# Patient Record
Sex: Male | Born: 1940
Health system: Southern US, Community
[De-identification: ages and names within clinical notes are randomized; demographics above are authoritative.]

## PROBLEM LIST (undated history)

## (undated) DIAGNOSIS — C649 Malignant neoplasm of unspecified kidney, except renal pelvis: Secondary | ICD-10-CM

## (undated) DIAGNOSIS — R7303 Prediabetes: Secondary | ICD-10-CM

## (undated) DIAGNOSIS — I10 Essential (primary) hypertension: Secondary | ICD-10-CM

## (undated) DIAGNOSIS — N400 Enlarged prostate without lower urinary tract symptoms: Secondary | ICD-10-CM

## (undated) DIAGNOSIS — E785 Hyperlipidemia, unspecified: Secondary | ICD-10-CM

## (undated) DIAGNOSIS — N2889 Other specified disorders of kidney and ureter: Secondary | ICD-10-CM

## (undated) DIAGNOSIS — E119 Type 2 diabetes mellitus without complications: Secondary | ICD-10-CM

## (undated) HISTORY — PX: APPENDECTOMY: SHX54

## (undated) HISTORY — DX: Other specified disorders of kidney and ureter: N28.89

## (undated) HISTORY — PX: TRANSURETHRAL RESECTION OF PROSTATE: SHX73

## (undated) HISTORY — PX: TONSILLECTOMY: SUR1361

## (undated) HISTORY — DX: Prediabetes: R73.03

## (undated) HISTORY — DX: Malignant neoplasm of unspecified kidney, except renal pelvis: C64.9

## (undated) HISTORY — DX: Hyperlipidemia, unspecified: E78.5

## (undated) HISTORY — DX: Benign prostatic hyperplasia without lower urinary tract symptoms: N40.0

## (undated) HISTORY — DX: Essential (primary) hypertension: I10

---

## 2004-04-20 ENCOUNTER — Ambulatory Visit: Payer: Self-pay | Admitting: General Surgery

## 2004-12-15 ENCOUNTER — Ambulatory Visit: Payer: Self-pay | Admitting: Internal Medicine

## 2004-12-21 ENCOUNTER — Ambulatory Visit: Payer: Self-pay | Admitting: Internal Medicine

## 2004-12-28 ENCOUNTER — Other Ambulatory Visit: Payer: Self-pay

## 2005-01-02 ENCOUNTER — Ambulatory Visit: Payer: Self-pay | Admitting: Urology

## 2005-01-08 DIAGNOSIS — C649 Malignant neoplasm of unspecified kidney, except renal pelvis: Secondary | ICD-10-CM

## 2005-01-08 HISTORY — DX: Malignant neoplasm of unspecified kidney, except renal pelvis: C64.9

## 2005-01-09 ENCOUNTER — Inpatient Hospital Stay: Payer: Self-pay | Admitting: Urology

## 2005-04-24 ENCOUNTER — Ambulatory Visit: Payer: Self-pay | Admitting: Urology

## 2005-07-02 ENCOUNTER — Emergency Department: Payer: Self-pay | Admitting: Emergency Medicine

## 2005-07-02 ENCOUNTER — Other Ambulatory Visit: Payer: Self-pay

## 2005-07-09 ENCOUNTER — Ambulatory Visit: Payer: Self-pay | Admitting: Internal Medicine

## 2005-07-10 ENCOUNTER — Ambulatory Visit: Payer: Self-pay | Admitting: Internal Medicine

## 2006-01-08 HISTORY — PX: RENAL MASS EXCISION: SHX2324

## 2006-01-18 ENCOUNTER — Ambulatory Visit: Payer: Self-pay | Admitting: Urology

## 2006-08-19 ENCOUNTER — Ambulatory Visit: Payer: Self-pay | Admitting: Urology

## 2007-01-23 ENCOUNTER — Ambulatory Visit: Payer: Self-pay | Admitting: Urology

## 2007-02-06 ENCOUNTER — Ambulatory Visit: Payer: Self-pay | Admitting: Urology

## 2007-08-13 ENCOUNTER — Ambulatory Visit: Payer: Self-pay | Admitting: Urology

## 2008-02-18 ENCOUNTER — Ambulatory Visit: Payer: Self-pay | Admitting: Urology

## 2008-04-22 ENCOUNTER — Inpatient Hospital Stay: Payer: Self-pay | Admitting: Internal Medicine

## 2008-05-21 ENCOUNTER — Ambulatory Visit: Payer: Self-pay | Admitting: Internal Medicine

## 2008-06-08 ENCOUNTER — Ambulatory Visit: Payer: Self-pay | Admitting: Internal Medicine

## 2008-07-08 ENCOUNTER — Ambulatory Visit: Payer: Self-pay | Admitting: Internal Medicine

## 2008-08-08 ENCOUNTER — Ambulatory Visit: Payer: Self-pay | Admitting: Internal Medicine

## 2008-08-19 ENCOUNTER — Ambulatory Visit: Payer: Self-pay | Admitting: Urology

## 2009-02-17 ENCOUNTER — Ambulatory Visit: Payer: Self-pay | Admitting: Urology

## 2009-08-23 ENCOUNTER — Ambulatory Visit: Payer: Self-pay | Admitting: Urology

## 2010-02-13 ENCOUNTER — Ambulatory Visit: Payer: Self-pay | Admitting: Urology

## 2011-03-13 ENCOUNTER — Ambulatory Visit: Payer: Self-pay | Admitting: Urology

## 2012-03-18 ENCOUNTER — Ambulatory Visit: Payer: Self-pay | Admitting: Urology

## 2012-04-20 ENCOUNTER — Ambulatory Visit: Payer: Self-pay

## 2013-02-18 ENCOUNTER — Ambulatory Visit: Payer: Self-pay | Admitting: Urology

## 2014-02-12 ENCOUNTER — Ambulatory Visit: Payer: Self-pay | Admitting: Urology

## 2014-08-25 ENCOUNTER — Other Ambulatory Visit: Payer: Self-pay

## 2014-08-25 DIAGNOSIS — N281 Cyst of kidney, acquired: Secondary | ICD-10-CM

## 2014-08-27 ENCOUNTER — Other Ambulatory Visit: Payer: Self-pay

## 2014-08-27 DIAGNOSIS — N281 Cyst of kidney, acquired: Secondary | ICD-10-CM

## 2015-02-04 DIAGNOSIS — E119 Type 2 diabetes mellitus without complications: Secondary | ICD-10-CM | POA: Diagnosis not present

## 2015-02-07 DIAGNOSIS — I10 Essential (primary) hypertension: Secondary | ICD-10-CM | POA: Diagnosis not present

## 2015-02-07 DIAGNOSIS — E119 Type 2 diabetes mellitus without complications: Secondary | ICD-10-CM | POA: Diagnosis not present

## 2015-02-14 ENCOUNTER — Ambulatory Visit
Admission: RE | Admit: 2015-02-14 | Discharge: 2015-02-14 | Disposition: A | Discharge status: Home / Self Care | Payer: PPO | Source: Ambulatory Visit | Attending: Urology | Admitting: Urology

## 2015-02-14 DIAGNOSIS — Q6100 Congenital renal cyst, unspecified: Secondary | ICD-10-CM | POA: Insufficient documentation

## 2015-02-14 DIAGNOSIS — N281 Cyst of kidney, acquired: Secondary | ICD-10-CM

## 2015-02-25 ENCOUNTER — Encounter: Payer: Self-pay | Admitting: Urology

## 2015-02-25 ENCOUNTER — Telehealth: Payer: Self-pay | Admitting: Urology

## 2015-02-25 ENCOUNTER — Ambulatory Visit (INDEPENDENT_AMBULATORY_CARE_PROVIDER_SITE_OTHER): Payer: PPO | Admitting: Urology

## 2015-02-25 VITALS — BP 134/77 | HR 60 | Ht 68.0 in | Wt 203.3 lb

## 2015-02-25 DIAGNOSIS — Z85528 Personal history of other malignant neoplasm of kidney: Secondary | ICD-10-CM | POA: Diagnosis not present

## 2015-02-25 DIAGNOSIS — Q6100 Congenital renal cyst, unspecified: Secondary | ICD-10-CM

## 2015-02-25 DIAGNOSIS — N281 Cyst of kidney, acquired: Secondary | ICD-10-CM

## 2015-02-25 NOTE — Progress Notes (Signed)
02/25/2015 5:18 PM   Mark Riggs [birth year] [id number]  Referring provider: No referring provider defined for this encounter.  Chief Complaint  Patient presents with  . Renal Mass    1year    HPI: 75 yo s/p open left partial nephrectomy by Dr. Ernst Spell in 2007 for Mark Riggs.  He returns for annual surviellence.    He denies any flank pain or gross hematuria.  Denies any voiding issues. No weight loss.  Follow-up renal ultrasound shows no evidence of recurrence of the mass. Previous CT scans reviewed before and after partial nephrectomy indicate that this was pT1a mass based on size (4 cm on previous imaging), pathology unknown. Records have been requested.   PMH: Past Medical History  Diagnosis Date  . Pre-diabetes   . Hypertension   . Hyperlipemia   . Renal mass   . Renal cancer (Mark Riggs) 2007    left  . BPH (benign prostatic hyperplasia)     Surgical History: Past Surgical History  Procedure Laterality Date  . Appendectomy    . Renal mass excision  2008  . Transurethral resection of prostate      patient unsure about this surgery long time ago by Dr. Ernst Spell    Home Medications:    Medication List       This list is accurate as of: 02/25/15 11:59 PM.  Always use your most recent med list.               ALPRAZolam 1 MG tablet  Commonly known as:  XANAX  Take 1 mg by mouth 3 (three) times daily as needed for anxiety.     aspirin 81 MG tablet  Take 81 mg by mouth daily.     glipiZIDE 5 MG tablet  Commonly known as:  GLUCOTROL  Take by mouth daily before breakfast.     simvastatin 10 MG tablet  Commonly known as:  ZOCOR  Take 10 mg by mouth daily.        Allergies:  Allergies  Allergen Reactions  . Contrast Media [Iodinated Diagnostic Agents] Swelling  . Sulfa Antibiotics Swelling    Family History: History reviewed. No pertinent family history.  Social History:  reports that he has quit smoking. He does not have any smokeless tobacco history  on file. He reports that he does not drink alcohol or use illicit drugs.  ROS: 12 point review of systems performed, and negative other than as per history of present illness.  Physical Exam: BP 134/77 mmHg  Pulse 60  Ht 5\' 8"  (1.727 m)  Wt 203 lb 4.8 oz (92.216 kg)  BMI 30.92 kg/m2  Constitutional:  Alert and oriented, No acute distress. HEENT: [place] AT, moist mucus membranes.  Trachea midline, no masses. Cardiovascular: No clubbing, cyanosis, or edema. Respiratory: Normal respiratory effort, no increased work of breathing. GI: Abdomen is soft, nontender, nondistended, no abdominal masses.  Left vertical flank incision identified, well-healed. No hernias. GU: No CVA tenderness.  Skin: No rashes, bruises or suspicious lesions. Neurologic: Grossly intact, no focal deficits, moving all 4 extremities. Psychiatric: Normal mood and affect.  Laboratory Data: N/a  Pertinent Imaging: CLINICAL DATA: Renal cyst, followup, history LEFT renal cancer 10 years ago  EXAM: RENAL / URINARY TRACT ULTRASOUND COMPLETE  COMPARISON: 02/12/2014  FINDINGS: Right Kidney:  Length: 12.0 cm. Normal cortical thickness. Borderline increased cortical echogenicity. No hydronephrosis or shadowing calcification. Tiny exophytic cyst at lower pole 12 x 11 x 12 mm. Larger cysts mid pole 4.2 x  4.2 x 3.7 cm, simple features, slightly larger than the 3.8 x 3.4 x 3.6 cm on prior exam. No solid mass.  Left Kidney:  Length: 10.9 cm. Multiple small cysts with largest at upper pole demonstrating simple features and measuring 3.1 x 1.9 x 3.4 cm, previously 2.3 x 2.3 x 1.8 cm. No solid mass, hydronephrosis or shadowing calcification.  Bladder:  Normal appearance. LEFT ureteral jet was visualized but with RIGHT ureteral jet was not seen during the period of imaging.  IMPRESSION: BILATERAL renal cysts, largest of which are slightly larger than on the previous exam but retaining simple features.  No  new abnormalities.   Electronically Signed  By: Lavonia Dana M.D.  On: 02/14/2015 14:50         Previous CT imaging prior to and following partial nephrectomy were reviewed today along with the above renal ultrasound.  Assessment & Plan:    1. History of kidney cancer 10 years status post left open partial nephrectomy. Follow-up renal ultrasound shows no evidence of recurrence. Given the relatively small size of the tumor without evidence of recurrence now 10 years later for a  pT1a tumor, no further surveillance is indicated.  Patient was advised to return if he develops flank pain, hematuria, or any other worrisome symptoms.  2. Renal cyst Benign bilateral simple cysts identified, asymptomatic.  Hollice Espy, MD  [facility] [address], [place], [place] [address] [phone]

## 2015-02-25 NOTE — Telephone Encounter (Signed)
Just F.Y.I. From pt Dr Erlene Quan saw this morning.  Alprazolam 1 mg 3 x per day Lisinopril 2.5 mg 1 x day Simvastatin 40 mg 1 x day Glimepiride 1 mg 1/2 x day, 1 whole next day, 1/2 next day, 1 whole next day

## 2015-03-02 ENCOUNTER — Encounter: Payer: Self-pay | Admitting: Urology

## 2015-03-16 ENCOUNTER — Other Ambulatory Visit: Payer: Self-pay | Admitting: Internal Medicine

## 2015-08-10 DIAGNOSIS — E119 Type 2 diabetes mellitus without complications: Secondary | ICD-10-CM | POA: Diagnosis not present

## 2015-08-15 DIAGNOSIS — K5730 Diverticulosis of large intestine without perforation or abscess without bleeding: Secondary | ICD-10-CM | POA: Diagnosis not present

## 2015-08-15 DIAGNOSIS — E119 Type 2 diabetes mellitus without complications: Secondary | ICD-10-CM | POA: Diagnosis not present

## 2015-08-15 DIAGNOSIS — E785 Hyperlipidemia, unspecified: Secondary | ICD-10-CM | POA: Diagnosis not present

## 2015-08-15 DIAGNOSIS — K210 Gastro-esophageal reflux disease with esophagitis: Secondary | ICD-10-CM | POA: Diagnosis not present

## 2015-09-22 DIAGNOSIS — Z23 Encounter for immunization: Secondary | ICD-10-CM | POA: Diagnosis not present

## 2016-02-15 DIAGNOSIS — E119 Type 2 diabetes mellitus without complications: Secondary | ICD-10-CM | POA: Diagnosis not present

## 2016-02-15 DIAGNOSIS — E785 Hyperlipidemia, unspecified: Secondary | ICD-10-CM | POA: Diagnosis not present

## 2016-02-22 DIAGNOSIS — E119 Type 2 diabetes mellitus without complications: Secondary | ICD-10-CM | POA: Diagnosis not present

## 2016-02-22 DIAGNOSIS — E785 Hyperlipidemia, unspecified: Secondary | ICD-10-CM | POA: Diagnosis not present

## 2016-03-06 DIAGNOSIS — L570 Actinic keratosis: Secondary | ICD-10-CM | POA: Diagnosis not present

## 2016-03-06 DIAGNOSIS — Z872 Personal history of diseases of the skin and subcutaneous tissue: Secondary | ICD-10-CM | POA: Diagnosis not present

## 2016-03-06 DIAGNOSIS — L821 Other seborrheic keratosis: Secondary | ICD-10-CM | POA: Diagnosis not present

## 2016-05-07 ENCOUNTER — Encounter: Payer: Self-pay | Admitting: Emergency Medicine

## 2016-05-07 ENCOUNTER — Ambulatory Visit
Admission: EM | Admit: 2016-05-07 | Discharge: 2016-05-07 | Disposition: A | Discharge status: Home / Self Care | Payer: PPO | Attending: Family Medicine | Admitting: Family Medicine

## 2016-05-07 DIAGNOSIS — S39012A Strain of muscle, fascia and tendon of lower back, initial encounter: Secondary | ICD-10-CM

## 2016-05-07 DIAGNOSIS — M545 Low back pain: Secondary | ICD-10-CM

## 2016-05-07 LAB — URINALYSIS, COMPLETE (UACMP) WITH MICROSCOPIC
Bacteria, UA: NONE SEEN
Bilirubin Urine: NEGATIVE
GLUCOSE, UA: 250 mg/dL — AB
HGB URINE DIPSTICK: NEGATIVE
Ketones, ur: NEGATIVE mg/dL
LEUKOCYTES UA: NEGATIVE
NITRITE: NEGATIVE
Protein, ur: NEGATIVE mg/dL
RBC / HPF: NONE SEEN RBC/hpf (ref 0–5)
SPECIFIC GRAVITY, URINE: 1.015 (ref 1.005–1.030)
Squamous Epithelial / LPF: NONE SEEN
pH: 5 (ref 5.0–8.0)

## 2016-05-07 MED ORDER — CYCLOBENZAPRINE HCL 10 MG PO TABS: 10.0000 mg | ORAL_TABLET | Freq: Every day | ORAL | 0 refills | Status: DC

## 2016-05-07 NOTE — ED Provider Notes (Signed)
[facility]    CSN: [id number] Arrival date & time: 05/07/16  0903     History   Chief Complaint Chief Complaint  Patient presents with  . Back Pain    left side    HPI Mark Riggs is a 76 y.o. male.    Back Pain  Location:  Lumbar spine Quality:  Aching Radiates to:  Does not radiate Pain severity:  Moderate Pain is:  Same all the time Onset quality:  Sudden Duration:  7 days Timing:  Constant Progression:  Unchanged Chronicity:  New Context: lifting heavy objects and twisting   Context: not emotional stress, not falling, not jumping from heights, not MCA, not MVA, not occupational injury, not pedestrian accident, not physical stress, not recent illness and not recent injury   Context comment:  States symptoms began after doing some lifting and twisting Relieved by:  Nothing Ineffective treatments:  OTC medications Associated symptoms: no abdominal pain, no abdominal swelling, no bladder incontinence, no bowel incontinence, no chest pain, no dysuria, no fever, no headaches, no leg pain, no numbness, no paresthesias, no pelvic pain, no perianal numbness, no tingling, no weakness and no weight loss   Risk factors: no recent surgery     Past Medical History:  Diagnosis Date  . BPH (benign prostatic hyperplasia)   . Hyperlipemia   . Hypertension   . Pre-diabetes   . Renal cancer ([place]) 2007   left  . Renal mass     There are no active problems to display for this patient.   Past Surgical History:  Procedure Laterality Date  . APPENDECTOMY    . RENAL MASS EXCISION  2008  . TRANSURETHRAL RESECTION OF PROSTATE     patient unsure about this surgery long time ago by Dr. Ernst Spell       Home Medications    Prior to Admission medications   Medication Sig Start Date End Date Taking? Authorizing Provider  glimepiride (AMARYL) 2 MG tablet Take 2 mg by mouth daily with breakfast.   Yes Historical Provider, MD  lisinopril (PRINIVIL,ZESTRIL) 5 MG  tablet Take 5 mg by mouth daily.   Yes Historical Provider, MD  simvastatin (ZOCOR) 40 MG tablet Take 40 mg by mouth daily.   Yes Historical Provider, MD  ALPRAZolam Duanne Moron) 1 MG tablet Take 1 mg by mouth 3 (three) times daily as needed for anxiety.    Historical Provider, MD  aspirin 81 MG tablet Take 81 mg by mouth daily.    Historical Provider, MD  cyclobenzaprine (FLEXERIL) 10 MG tablet Take 1 tablet (10 mg total) by mouth at bedtime. 05/07/16   Norval Gable, MD    Family History History reviewed. No pertinent family history.  Social History Social History  Substance Use Topics  . Smoking status: Former Research scientist (life sciences)  . Smokeless tobacco: Never Used  . Alcohol use No     Allergies   Contrast media [iodinated diagnostic agents] and Sulfa antibiotics   Review of Systems Review of Systems  Constitutional: Negative for fever and weight loss.  Cardiovascular: Negative for chest pain.  Gastrointestinal: Negative for abdominal pain and bowel incontinence.  Genitourinary: Negative for bladder incontinence, dysuria and pelvic pain.  Musculoskeletal: Positive for back pain.  Neurological: Negative for tingling, weakness, numbness, headaches and paresthesias.     Physical Exam Triage Vital Signs ED Triage Vitals  Enc Vitals Group     BP 05/07/16 0939 (!) 141/66     Pulse Rate 05/07/16 0939 (!) 57  Resp 05/07/16 0939 16     Temp 05/07/16 0939 97.7 F (36.5 C)     Temp Source 05/07/16 0939 Oral     SpO2 05/07/16 0939 100 %     Weight 05/07/16 0936 200 lb (90.7 kg)     Height 05/07/16 0936 5\' 8"  (1.727 m)     Head Circumference --      Peak Flow --      Pain Score 05/07/16 0936 8     Pain Loc --      Pain Edu? --      Excl. in [place]? --    No data found.   Updated Vital Signs BP (!) 141/66 (BP Location: Right Arm)   Pulse (!) 57   Temp 97.7 F (36.5 C) (Oral)   Resp 16   Ht 5\' 8"  (1.727 m)   Wt 200 lb (90.7 kg)   SpO2 100%   BMI 30.41 kg/m   Visual Acuity Right  Eye Distance:   Left Eye Distance:   Bilateral Distance:    Right Eye Near:   Left Eye Near:    Bilateral Near:     Physical Exam  Constitutional: He appears well-developed and well-nourished. No distress.  Neck: Normal range of motion. Neck supple. No tracheal deviation present.  Pulmonary/Chest: Effort normal. No stridor. No respiratory distress.  Musculoskeletal:       Cervical back: Normal. He exhibits normal range of motion, no tenderness, no bony tenderness, no swelling, no edema, no deformity, no laceration, no pain, no spasm and normal pulse.       Lumbar back: He exhibits tenderness (over the lumbar paraspinous muscles on the left) and spasm. He exhibits normal range of motion, no bony tenderness, no swelling, no edema, no deformity, no laceration, no pain and normal pulse.  Neurological: He is alert. He has normal reflexes. He displays normal reflexes. He exhibits normal muscle tone. Coordination normal.  Skin: No rash noted. He is not diaphoretic.  Nursing note and vitals reviewed.    UC Treatments / Results  Labs (all labs ordered are listed, but only abnormal results are displayed) Labs Reviewed  URINALYSIS, COMPLETE (UACMP) WITH MICROSCOPIC - Abnormal; Notable for the following:       Result Value   Glucose, UA 250 (*)    All other components within normal limits    EKG  EKG Interpretation None       Radiology No results found.  Procedures Procedures (including critical care time)  Medications Ordered in UC Medications - No data to display   Initial Impression / Assessment and Plan / UC Course  I have reviewed the triage vital signs and the nursing notes.  Pertinent labs & imaging results that were available during my care of the patient were reviewed by me and considered in my medical decision making (see chart for details).       Final Clinical Impressions(s) / UC Diagnoses   Final diagnoses:  Strain of lumbar region, initial encounter     New Prescriptions Discharge Medication List as of 05/07/2016 10:11 AM    START taking these medications   Details  cyclobenzaprine (FLEXERIL) 10 MG tablet Take 1 tablet (10 mg total) by mouth at bedtime., Starting Mon 05/07/2016, Normal       1. Lab results and diagnosis reviewed with patient 2. rx as per orders above; reviewed possible side effects, interactions, risks and benefits  3. Recommend supportive treatment with otc analgesics prn, heat/ice, stretching 4. Follow-up  prn if symptoms worsen or don't improve   Norval Gable, MD 05/07/16 1235

## 2016-05-07 NOTE — ED Triage Notes (Signed)
Patient c/o left sided lower back pain that started last Monday.  Patient reports some burning when urinating.

## 2016-08-22 DIAGNOSIS — E785 Hyperlipidemia, unspecified: Secondary | ICD-10-CM | POA: Diagnosis not present

## 2016-08-22 DIAGNOSIS — E119 Type 2 diabetes mellitus without complications: Secondary | ICD-10-CM | POA: Diagnosis not present

## 2016-08-29 DIAGNOSIS — K210 Gastro-esophageal reflux disease with esophagitis: Secondary | ICD-10-CM | POA: Diagnosis not present

## 2016-08-29 DIAGNOSIS — E785 Hyperlipidemia, unspecified: Secondary | ICD-10-CM | POA: Diagnosis not present

## 2016-08-29 DIAGNOSIS — E119 Type 2 diabetes mellitus without complications: Secondary | ICD-10-CM | POA: Diagnosis not present

## 2016-11-03 ENCOUNTER — Ambulatory Visit
Admission: EM | Admit: 2016-11-03 | Discharge: 2016-11-03 | Disposition: A | Discharge status: Home / Self Care | Payer: PPO | Attending: Family Medicine | Admitting: Family Medicine

## 2016-11-03 ENCOUNTER — Encounter: Payer: Self-pay | Admitting: Gynecology

## 2016-11-03 DIAGNOSIS — S39012A Strain of muscle, fascia and tendon of lower back, initial encounter: Secondary | ICD-10-CM | POA: Diagnosis not present

## 2016-11-03 LAB — URINALYSIS, COMPLETE (UACMP) WITH MICROSCOPIC
Bacteria, UA: NONE SEEN
Bilirubin Urine: NEGATIVE
GLUCOSE, UA: 250 mg/dL — AB
HGB URINE DIPSTICK: NEGATIVE
Ketones, ur: NEGATIVE mg/dL
LEUKOCYTES UA: NEGATIVE
NITRITE: NEGATIVE
PROTEIN: NEGATIVE mg/dL
SQUAMOUS EPITHELIAL / LPF: NONE SEEN
Specific Gravity, Urine: 1.02 (ref 1.005–1.030)
pH: 5.5 (ref 5.0–8.0)

## 2016-11-03 MED ORDER — CYCLOBENZAPRINE HCL 10 MG PO TABS: 10.0000 mg | ORAL_TABLET | Freq: Every day | ORAL | 0 refills | Status: DC

## 2016-11-03 NOTE — ED Triage Notes (Signed)
Patient c/o lower back pain x 4 days. Patient deny injury to his back.

## 2016-11-03 NOTE — ED Provider Notes (Signed)
[facility]    CSN: [id number] Arrival date & time: 11/03/16  1203     History   Chief Complaint Chief Complaint  Patient presents with  . Back Pain    HPI Mark Riggs is a 76 y.o. male.   The history is provided by the patient.  Back Pain  Location:  Lumbar spine Quality:  Aching Radiates to:  Does not radiate Pain severity:  Moderate Pain is:  Same all the time Onset quality:  Sudden Duration:  4 days Timing:  Constant Progression:  Unchanged Chronicity:  New Context: twisting   Relieved by:  Nothing Ineffective treatments:  Cold packs Associated symptoms: no abdominal pain, no abdominal swelling, no bladder incontinence, no bowel incontinence, no chest pain, no dysuria, no fever, no headaches, no leg pain, no numbness, no paresthesias, no pelvic pain, no perianal numbness, no tingling, no weakness and no weight loss     Past Medical History:  Diagnosis Date  . BPH (benign prostatic hyperplasia)   . Hyperlipemia   . Hypertension   . Pre-diabetes   . Renal cancer ([place]) 2007   left  . Renal mass     There are no active problems to display for this patient.   Past Surgical History:  Procedure Laterality Date  . APPENDECTOMY    . RENAL MASS EXCISION  2008  . TRANSURETHRAL RESECTION OF PROSTATE     patient unsure about this surgery long time ago by Dr. Ernst Spell       Home Medications    Prior to Admission medications   Medication Sig Start Date End Date Taking? Authorizing Provider  ALPRAZolam Duanne Moron) 1 MG tablet Take 1 mg by mouth 3 (three) times daily as needed for anxiety.   Yes [provider]  aspirin 81 MG tablet Take 81 mg by mouth daily.   Yes [provider]  glimepiride (AMARYL) 2 MG tablet Take 2 mg by mouth daily with breakfast.   Yes [provider]  lisinopril (PRINIVIL,ZESTRIL) 5 MG tablet Take 5 mg by mouth daily.   Yes [provider]  simvastatin (ZOCOR) 40 MG tablet Take 40 mg by  mouth daily.   Yes [provider]  cyclobenzaprine (FLEXERIL) 10 MG tablet Take 1 tablet (10 mg total) by mouth at bedtime. 11/03/16   Norval Gable, MD    Family History No family history on file.  Social History Social History  Substance Use Topics  . Smoking status: Former Research scientist (life sciences)  . Smokeless tobacco: Never Used  . Alcohol use No     Allergies   Contrast media [iodinated diagnostic agents] and Sulfa antibiotics   Review of Systems Review of Systems  Constitutional: Negative for fever and weight loss.  Cardiovascular: Negative for chest pain.  Gastrointestinal: Negative for abdominal pain and bowel incontinence.  Genitourinary: Negative for bladder incontinence, dysuria and pelvic pain.  Musculoskeletal: Positive for back pain.  Neurological: Negative for tingling, weakness, numbness, headaches and paresthesias.     Physical Exam Triage Vital Signs ED Triage Vitals  Enc Vitals Group     BP 11/03/16 1248 (!) 143/87     Pulse Rate 11/03/16 1248 64     Resp 11/03/16 1248 16     Temp 11/03/16 1248 97.7 F (36.5 C)     Temp Source 11/03/16 1248 Oral     SpO2 11/03/16 1248 100 %     Weight 11/03/16 1249 200 lb (90.7 kg)     Height 11/03/16 1249 5'  8" (1.727 m)     Head Circumference --      Peak Flow --      Pain Score 11/03/16 1249 9     Pain Loc --      Pain Edu? --      Excl. in [place]? --    No data found.   Updated Vital Signs BP (!) 143/87 (BP Location: Left Arm)   Pulse 64   Temp 97.7 F (36.5 C) (Oral)   Resp 16   Ht 5\' 8"  (1.727 m)   Wt 200 lb (90.7 kg)   SpO2 100%   BMI 30.41 kg/m   Visual Acuity Right Eye Distance:   Left Eye Distance:   Bilateral Distance:    Right Eye Near:   Left Eye Near:    Bilateral Near:     Physical Exam  Constitutional: He appears well-developed and well-nourished. No distress.  Neck: Normal range of motion. Neck supple. No tracheal deviation present.  Pulmonary/Chest: Effort normal. No stridor. No  respiratory distress.  Musculoskeletal:       Lumbar back: He exhibits tenderness and spasm. He exhibits normal range of motion, no bony tenderness, no swelling, no edema, no deformity, no laceration, no pain and normal pulse.  Neurological: He is alert. He has normal reflexes. He displays normal reflexes. He exhibits normal muscle tone. Coordination normal.  Skin: No rash noted. He is not diaphoretic.  Nursing note and vitals reviewed.    UC Treatments / Results  Labs (all labs ordered are listed, but only abnormal results are displayed) Labs Reviewed  URINALYSIS, COMPLETE (UACMP) WITH MICROSCOPIC - Abnormal; Notable for the following:       Result Value   Color, Urine AMBER (*)    Glucose, UA 250 (*)    All other components within normal limits    EKG  EKG Interpretation None       Radiology No results found.  Procedures Procedures (including critical care time)  Medications Ordered in UC Medications - No data to display   Initial Impression / Assessment and Plan / UC Course  I have reviewed the triage vital signs and the nursing notes.  Pertinent labs & imaging results that were available during my care of the patient were reviewed by me and considered in my medical decision making (see chart for details).       Final Clinical Impressions(s) / UC Diagnoses   Final diagnoses:  Strain of lumbar region, initial encounter    New Prescriptions New Prescriptions   CYCLOBENZAPRINE (FLEXERIL) 10 MG TABLET    Take 1 tablet (10 mg total) by mouth at bedtime.   1. diagnosis reviewed with patient 2. rx as per orders above; reviewed possible side effects, interactions, risks and benefits  3. Recommend supportive treatment with stretch, heat/ice, otc analgesics prn 4. Follow-up prn if symptoms worsen or don't improve  Controlled Substance Prescriptions  Controlled Substance Registry consulted? Not Applicable   Norval Gable, MD 11/03/16 1323

## 2017-01-24 ENCOUNTER — Ambulatory Visit (INDEPENDENT_AMBULATORY_CARE_PROVIDER_SITE_OTHER): Payer: PPO

## 2017-01-24 ENCOUNTER — Ambulatory Visit
Admission: EM | Admit: 2017-01-24 | Discharge: 2017-01-24 | Disposition: A | Discharge status: Home / Self Care | Payer: PPO | Attending: Family Medicine | Admitting: Family Medicine

## 2017-01-24 ENCOUNTER — Encounter: Payer: Self-pay | Admitting: Emergency Medicine

## 2017-01-24 ENCOUNTER — Other Ambulatory Visit: Payer: Self-pay

## 2017-01-24 DIAGNOSIS — R062 Wheezing: Secondary | ICD-10-CM | POA: Diagnosis not present

## 2017-01-24 DIAGNOSIS — J40 Bronchitis, not specified as acute or chronic: Secondary | ICD-10-CM

## 2017-01-24 DIAGNOSIS — R05 Cough: Secondary | ICD-10-CM | POA: Diagnosis not present

## 2017-01-24 DIAGNOSIS — R0602 Shortness of breath: Secondary | ICD-10-CM | POA: Diagnosis not present

## 2017-01-24 MED ORDER — BENZONATATE 100 MG PO CAPS: 100.0000 mg | ORAL_CAPSULE | Freq: Three times a day (TID) | ORAL | 0 refills | Status: DC | PRN

## 2017-01-24 MED ORDER — CYCLOBENZAPRINE HCL 10 MG PO TABS: 10.0000 mg | ORAL_TABLET | Freq: Three times a day (TID) | ORAL | 0 refills | Status: DC | PRN

## 2017-01-24 MED ORDER — AZITHROMYCIN 250 MG PO TABS: ORAL_TABLET | ORAL | 0 refills | Status: DC

## 2017-01-24 NOTE — ED Triage Notes (Signed)
Patient c/o cough and chest congestion for 3-4 days.  Patient reports low grade fever.

## 2017-01-24 NOTE — ED Provider Notes (Signed)
[facility]    CSN: [id number] Arrival date & time: 01/24/17  1219  History   Chief Complaint Chief Complaint  Patient presents with  . Cough   HPI 77 year old male who is a former smoker presents with cough, wheezing, shortness of breath.  Patient reports he has been sick for the past 3-4 days.  He said cough which is productive.  He states that he has had associated wheezing and shortness of breath.  Worse at night.  No relieving factors.  He reports subjective fever but he has not taken his temperature.  No other associated symptoms.  No other complaints at this time.   Past Medical History:  Diagnosis Date  . BPH (benign prostatic hyperplasia)   . Hyperlipemia   . Hypertension   . Pre-diabetes   . Renal cancer ([place]) 2007   left  . Renal mass    Past Surgical History:  Procedure Laterality Date  . APPENDECTOMY    . RENAL MASS EXCISION  2008  . TRANSURETHRAL RESECTION OF PROSTATE     patient unsure about this surgery long time ago by Dr. Ernst Spell   Home Medications    Prior to Admission medications   Medication Sig Start Date End Date Taking? Authorizing Provider  ALPRAZolam Duanne Moron) 1 MG tablet Take 1 mg by mouth 3 (three) times daily as needed for anxiety.   Yes [provider]  aspirin 81 MG tablet Take 81 mg by mouth daily.   Yes [provider]  glimepiride (AMARYL) 2 MG tablet Take 2 mg by mouth daily with breakfast.   Yes [provider]  lisinopril (PRINIVIL,ZESTRIL) 5 MG tablet Take 5 mg by mouth daily.   Yes [provider]  simvastatin (ZOCOR) 40 MG tablet Take 40 mg by mouth daily.   Yes [provider]  azithromycin (ZITHROMAX) 250 MG tablet 2 tablets on day 1, then 1 tablet daily on days 2-5. 01/24/17   Coral Spikes, DO  benzonatate (TESSALON) 100 MG capsule Take 1 capsule (100 mg total) by mouth 3 (three) times daily as needed. 01/24/17   Coral Spikes, DO  cyclobenzaprine (FLEXERIL) 10 MG tablet Take  1 tablet (10 mg total) by mouth 3 (three) times daily as needed for muscle spasms. 01/24/17   Coral Spikes, DO   Family History History reviewed. No pertinent family history.  Social History Social History   Tobacco Use  . Smoking status: Former Research scientist (life sciences)  . Smokeless tobacco: Never Used  Substance Use Topics  . Alcohol use: No    Alcohol/week: 0.0 oz  . Drug use: No    Allergies   Contrast media [iodinated diagnostic agents]; Sulfa antibiotics; and Tetracyclines & related   Review of Systems Review of Systems  Constitutional: Positive for fever.  Respiratory: Positive for cough, shortness of breath and wheezing.    Physical Exam Triage Vital Signs ED Triage Vitals  Enc Vitals Group     BP 01/24/17 1233 121/70     Pulse Rate 01/24/17 1233 71     Resp 01/24/17 1233 16     Temp 01/24/17 1233 98.2 F (36.8 C)     Temp Source 01/24/17 1233 Oral     SpO2 01/24/17 1233 100 %     Weight 01/24/17 1228 200 lb (90.7 kg)     Height 01/24/17 1228 5\' 8"  (1.727 m)     Head Circumference --      Peak Flow --      Pain  Score 01/24/17 1228 3     Pain Loc --      Pain Edu? --      Excl. in [place]? --    Updated Vital Signs BP 121/70 (BP Location: Left Arm)   Pulse 71   Temp 98.2 F (36.8 C) (Oral)   Resp 16   Ht 5\' 8"  (1.727 m)   Wt 200 lb (90.7 kg)   SpO2 100%   BMI 30.41 kg/m   Physical Exam  Constitutional: He is oriented to person, place, and time. He appears well-developed and well-nourished. No distress.  HENT:  Head: Normocephalic and atraumatic.  Nose: Nose normal.  Mouth/Throat: Oropharynx is clear and moist.  Cardiovascular: Normal rate and regular rhythm.  Pulmonary/Chest: Effort normal and breath sounds normal. He has no wheezes. He has no rales.  Neurological: He is alert and oriented to person, place, and time.  Skin: Skin is warm. No rash noted.  Psychiatric: He has a normal mood and affect. His behavior is normal.  Nursing note and vitals reviewed.  UC  Treatments / Results  Labs (all labs ordered are listed, but only abnormal results are displayed) Labs Reviewed - No data to display  EKG  EKG Interpretation None       Radiology Dg Chest 2 View  Result Date: 01/24/2017 CLINICAL DATA:  Cough for 4 days.  History of renal cell carcinoma EXAM: CHEST  2 VIEW COMPARISON:  April 20, 2012 FINDINGS: Lungs are clear. The heart size and pulmonary vascularity are normal. No adenopathy. There is aortic atherosclerosis. No blastic or lytic bone lesions. IMPRESSION: No edema or consolidation. No adenopathy. There is aortic atherosclerosis. Aortic Atherosclerosis (ICD10-I70.0). Electronically Signed   By: Lowella Grip III M.D.   On: 01/24/2017 13:07    Procedures Procedures (including critical care time)  Medications Ordered in UC Medications - No data to display   Initial Impression / Assessment and Plan / UC Course  I have reviewed the triage vital signs and the nursing notes.  Pertinent labs & imaging results that were available during my care of the patient were reviewed by me and considered in my medical decision making (see chart for details).     77 year old male presents with acute bronchitis.  Given patient's prior tobacco abuse, and concern for underlying COPD, treating with azithromycin.  Tessalon Perles for cough.  Patient requests refill on Flexeril and Rx was sent.  Final Clinical Impressions(s) / UC Diagnoses   Final diagnoses:  Bronchitis    ED Discharge Orders        Ordered    cyclobenzaprine (FLEXERIL) 10 MG tablet  3 times daily PRN     01/24/17 1344    azithromycin (ZITHROMAX) 250 MG tablet     01/24/17 1346    benzonatate (TESSALON) 100 MG capsule  3 times daily PRN     01/24/17 1346     Controlled Substance Prescriptions [place] Controlled Substance Registry consulted? Not Applicable   Coral Spikes, DO 01/24/17 1349

## 2017-01-24 NOTE — Discharge Instructions (Signed)
Medications as prescribed. ° °Take care ° °Dr. Marguerite Barba  °

## 2017-01-27 ENCOUNTER — Telehealth: Payer: Self-pay

## 2017-01-27 NOTE — Telephone Encounter (Signed)
Called to follow up with patient since visit here at [facility]. Patient instructed to call back with any questions or concerns. MAH  

## 2017-02-05 ENCOUNTER — Encounter: Payer: Self-pay | Admitting: General Surgery

## 2017-03-06 DIAGNOSIS — E785 Hyperlipidemia, unspecified: Secondary | ICD-10-CM | POA: Diagnosis not present

## 2017-03-06 DIAGNOSIS — E119 Type 2 diabetes mellitus without complications: Secondary | ICD-10-CM | POA: Diagnosis not present

## 2017-03-13 DIAGNOSIS — E785 Hyperlipidemia, unspecified: Secondary | ICD-10-CM | POA: Diagnosis not present

## 2017-03-13 DIAGNOSIS — E119 Type 2 diabetes mellitus without complications: Secondary | ICD-10-CM | POA: Diagnosis not present

## 2017-03-13 DIAGNOSIS — K210 Gastro-esophageal reflux disease with esophagitis: Secondary | ICD-10-CM | POA: Diagnosis not present

## 2017-04-29 ENCOUNTER — Emergency Department: Payer: PPO

## 2017-04-29 ENCOUNTER — Emergency Department
Admission: EM | Admit: 2017-04-29 | Discharge: 2017-04-29 | Disposition: A | Discharge status: Home / Self Care | Payer: PPO | Attending: Emergency Medicine | Admitting: Emergency Medicine

## 2017-04-29 ENCOUNTER — Encounter: Payer: Self-pay | Admitting: Emergency Medicine

## 2017-04-29 ENCOUNTER — Other Ambulatory Visit: Payer: Self-pay

## 2017-04-29 DIAGNOSIS — Z87891 Personal history of nicotine dependence: Secondary | ICD-10-CM | POA: Insufficient documentation

## 2017-04-29 DIAGNOSIS — Z7984 Long term (current) use of oral hypoglycemic drugs: Secondary | ICD-10-CM | POA: Insufficient documentation

## 2017-04-29 DIAGNOSIS — Z79899 Other long term (current) drug therapy: Secondary | ICD-10-CM | POA: Insufficient documentation

## 2017-04-29 DIAGNOSIS — Z7982 Long term (current) use of aspirin: Secondary | ICD-10-CM | POA: Insufficient documentation

## 2017-04-29 DIAGNOSIS — I10 Essential (primary) hypertension: Secondary | ICD-10-CM | POA: Diagnosis not present

## 2017-04-29 DIAGNOSIS — R270 Ataxia, unspecified: Secondary | ICD-10-CM | POA: Diagnosis not present

## 2017-04-29 DIAGNOSIS — R42 Dizziness and giddiness: Secondary | ICD-10-CM

## 2017-04-29 DIAGNOSIS — R079 Chest pain, unspecified: Secondary | ICD-10-CM | POA: Diagnosis not present

## 2017-04-29 LAB — BASIC METABOLIC PANEL
Anion gap: 8 (ref 5–15)
BUN: 17 mg/dL (ref 6–20)
CALCIUM: 8.7 mg/dL — AB (ref 8.9–10.3)
CHLORIDE: 102 mmol/L (ref 101–111)
CO2: 25 mmol/L (ref 22–32)
Creatinine, Ser: 1.06 mg/dL (ref 0.61–1.24)
GFR calc non Af Amer: >60 mL/min (ref >60)
GLUCOSE: 176 mg/dL — AB (ref 65–99)
Potassium: 4 mmol/L (ref 3.5–5.1)
Sodium: 135 mmol/L (ref 135–145)

## 2017-04-29 LAB — CBC
HEMATOCRIT: 42.6 % (ref 40.0–52.0)
HEMOGLOBIN: 15.1 g/dL (ref 13.0–18.0)
MCH: 31 pg (ref 26.0–34.0)
MCHC: 35.5 g/dL (ref 32.0–36.0)
MCV: 87.5 fL (ref 80.0–100.0)
Platelets: 145 10*3/uL — ABNORMAL LOW (ref 150–440)
RBC: 4.87 MIL/uL (ref 4.40–5.90)
RDW: 13.6 % (ref 11.5–14.5)
WBC: 7.1 10*3/uL (ref 3.8–10.6)

## 2017-04-29 LAB — GLUCOSE, CAPILLARY: Glucose-Capillary: 173 mg/dL — ABNORMAL HIGH (ref 65–99)

## 2017-04-29 LAB — TROPONIN I

## 2017-04-29 MED ORDER — MECLIZINE HCL 25 MG PO TABS: 25.0000 mg | ORAL_TABLET | Freq: Three times a day (TID) | ORAL | 0 refills | Status: DC | PRN

## 2017-04-29 MED ORDER — MECLIZINE HCL 25 MG PO TABS
25.0000 mg | ORAL_TABLET | Freq: Once | ORAL | Status: AC
  Administered 2017-04-29: 25 mg via ORAL
  Filled 2017-04-29: qty 1

## 2017-04-29 NOTE — ED Provider Notes (Signed)
[facility] Emergency Department Provider Note   ____________________________________________    I have reviewed the triage vital signs and the nursing notes.   HISTORY  Chief Complaint Dizziness    HPI Mark Riggs is a 77 y.o. male who presents with complaints of dizziness.  Patient reports this morning when he got out of bed he felt unsteady and that the room was shifting on him.  His wife reports that he does have a history of vertigo and that she suspected this but it seemed worse than typical.  He has not had it in several years.  Patient denies neuro deficits.  No headache.  No fevers chills or neck pain.  He became anxious and did develop some chest tightness which resolved after a minute or 2.  Past Medical History:  Diagnosis Date  . BPH (benign prostatic hyperplasia)   . Hyperlipemia   . Hypertension   . Pre-diabetes   . Renal cancer ([place]) 2007   left  . Renal mass     There are no active problems to display for this patient.   Past Surgical History:  Procedure Laterality Date  . APPENDECTOMY    . RENAL MASS EXCISION  2008  . TRANSURETHRAL RESECTION OF PROSTATE     patient unsure about this surgery long time ago by Dr. Ernst Spell    Prior to Admission medications   Medication Sig Start Date End Date Taking? Authorizing Provider  ALPRAZolam Duanne Moron) 1 MG tablet Take 1 mg by mouth 3 (three) times daily as needed for anxiety.    [provider]  aspirin 81 MG tablet Take 81 mg by mouth daily.    [provider]  azithromycin (ZITHROMAX) 250 MG tablet 2 tablets on day 1, then 1 tablet daily on days 2-5. 01/24/17   Coral Spikes, DO  benzonatate (TESSALON) 100 MG capsule Take 1 capsule (100 mg total) by mouth 3 (three) times daily as needed. 01/24/17   Coral Spikes, DO  cyclobenzaprine (FLEXERIL) 10 MG tablet Take 1 tablet (10 mg total) by mouth 3 (three) times daily as needed for muscle spasms. 01/24/17   Coral Spikes,  DO  glimepiride (AMARYL) 2 MG tablet Take 2 mg by mouth daily with breakfast.    [provider]  lisinopril (PRINIVIL,ZESTRIL) 5 MG tablet Take 5 mg by mouth daily.    [provider]  meclizine (ANTIVERT) 25 MG tablet Take 1 tablet (25 mg total) by mouth 3 (three) times daily as needed for dizziness. 04/29/17   Lavonia Drafts, MD  simvastatin (ZOCOR) 40 MG tablet Take 40 mg by mouth daily.    [provider]     Allergies Contrast media [iodinated diagnostic agents]; Sulfa antibiotics; and Tetracyclines & related  No family history on file.  Social History Social History   Tobacco Use  . Smoking status: Former Research scientist (life sciences)  . Smokeless tobacco: Never Used  Substance Use Topics  . Alcohol use: No    Alcohol/week: 0.0 oz  . Drug use: No    Review of Systems  Constitutional: No fever/chills Eyes: No visual changes.  ENT: No sore throat. Cardiovascular: As above Respiratory: Denies shortness of breath. Gastrointestinal: No abdominal pain.  No nausea, no vomiting.   Genitourinary: Negative for dysuria. Musculoskeletal: Negative for back pain. Skin: Negative for rash. Neurological: dizziness as above   ____________________________________________   PHYSICAL EXAM:  VITAL SIGNS: ED Triage Vitals  Enc Vitals Group     BP  04/29/17 1105 (!) 147/79     Pulse Rate 04/29/17 1105 (!) 53     Resp 04/29/17 1105 14     Temp --      Temp src --      SpO2 04/29/17 1105 100 %     Weight 04/29/17 1106 90.7 kg (200 lb)     Height 04/29/17 1106 1.727 m (5\' 8" )     Head Circumference --      Peak Flow --      Pain Score 04/29/17 1106 0     Pain Loc --      Pain Edu? --      Excl. in [place]? --     Constitutional: Alert and oriented. No acute distress. Pleasant and interactive Eyes: Conjunctivae are normal.  PERRLA Head: Atraumatic Nose: No congestion/rhinnorhea. Mouth/Throat: Mucous membranes are moist.   Neck:  Painless ROM Cardiovascular: Normal rate,  regular rhythm. Grossly normal heart sounds.  Good peripheral circulation. Respiratory: Normal respiratory effort.  No retractions. Gastrointestinal: Soft and nontender. No distention.  No CVA tenderness. Genitourinary: deferred Musculoskeletal:   Warm and well perfused Neurologic:  Normal speech and language. No gross focal neurologic deficits are appreciated.  Negative Romberg, not no dysdiadochokinesis Skin:  Skin is warm, dry and intact. No rash noted. Psychiatric: Mood and affect are normal. Speech and behavior are normal.  ____________________________________________   LABS (all labs ordered are listed, but only abnormal results are displayed)  Labs Reviewed  BASIC METABOLIC PANEL - Abnormal; Notable for the following components:      Result Value   Glucose, Bld 176 (*)    Calcium 8.7 (*)    All other components within normal limits  CBC - Abnormal; Notable for the following components:   Platelets 145 (*)    All other components within normal limits  GLUCOSE, CAPILLARY - Abnormal; Notable for the following components:   Glucose-Capillary 173 (*)    All other components within normal limits  TROPONIN I   ____________________________________________  EKG  ED ECG REPORT I, Lavonia Drafts, the attending physician, personally viewed and interpreted this ECG.  Date: 04/29/2017  Rhythm: normal sinus rhythm QRS Axis: normal Intervals: normal ST/T Wave abnormalities: normal Narrative Interpretation: no evidence of acute ischemia  ____________________________________________  RADIOLOGY  CT head unremarkable Chest x-ray unremarkable ____________________________________________   PROCEDURES  Procedure(s) performed: No  Procedures   Critical Care performed: No ____________________________________________   INITIAL IMPRESSION / ASSESSMENT AND PLAN / ED COURSE  Pertinent labs & imaging results that were available during my care of the patient were reviewed by me  and considered in my medical decision making (see chart for details).  Patient well-appearing in no acute distress complaining of dizziness and room shifting sounds consistent with vertigo.  No neuro deficits.  Brief period of chest pain which she attributes to nerves, troponin normal, EKG reassuring.  Neuro exam reassuring.  CT head unremarkable.  Labs otherwise unremarkable.  Treated with meclizine with significant improvement.  Patient ambulating easily around the room, significant improvement from initial exam.  Offered admission for further evaluation but he would like to go home, he states he can return if needed    ____________________________________________   FINAL CLINICAL IMPRESSION(S) / ED DIAGNOSES  Final diagnoses:  Vertigo        Note:  This document was prepared using Dragon voice recognition software and may include unintentional dictation errors.    Lavonia Drafts, MD 04/29/17 1505

## 2017-04-29 NOTE — ED Triage Notes (Addendum)
Pt reports centralized chest pain/tightness this morning accompanied with dizziness. Pt reports rolling over in the bed and getting dizzy, have trouble walking today.

## 2017-04-29 NOTE — ED Notes (Signed)
Patient transported to CT 

## 2017-04-29 NOTE — ED Notes (Signed)
Ambulated to BR.  Gait appears steadier.  Patient tolerated well.  Patient states he still feels dizzy, no improvement after taking the meclizine.

## 2017-09-20 DIAGNOSIS — E785 Hyperlipidemia, unspecified: Secondary | ICD-10-CM | POA: Diagnosis not present

## 2017-09-20 DIAGNOSIS — Z125 Encounter for screening for malignant neoplasm of prostate: Secondary | ICD-10-CM | POA: Diagnosis not present

## 2017-09-20 DIAGNOSIS — E119 Type 2 diabetes mellitus without complications: Secondary | ICD-10-CM | POA: Diagnosis not present

## 2017-09-27 DIAGNOSIS — E119 Type 2 diabetes mellitus without complications: Secondary | ICD-10-CM | POA: Diagnosis not present

## 2017-09-27 DIAGNOSIS — E785 Hyperlipidemia, unspecified: Secondary | ICD-10-CM | POA: Diagnosis not present

## 2017-09-27 DIAGNOSIS — Z23 Encounter for immunization: Secondary | ICD-10-CM | POA: Diagnosis not present

## 2017-11-01 DIAGNOSIS — Z1283 Encounter for screening for malignant neoplasm of skin: Secondary | ICD-10-CM | POA: Diagnosis not present

## 2017-11-01 DIAGNOSIS — L578 Other skin changes due to chronic exposure to nonionizing radiation: Secondary | ICD-10-CM | POA: Diagnosis not present

## 2017-11-01 DIAGNOSIS — L570 Actinic keratosis: Secondary | ICD-10-CM | POA: Diagnosis not present

## 2017-11-01 DIAGNOSIS — L821 Other seborrheic keratosis: Secondary | ICD-10-CM | POA: Diagnosis not present

## 2017-11-01 DIAGNOSIS — L720 Epidermal cyst: Secondary | ICD-10-CM | POA: Diagnosis not present

## 2017-11-25 ENCOUNTER — Other Ambulatory Visit: Payer: Self-pay

## 2017-11-25 ENCOUNTER — Ambulatory Visit
Admission: EM | Admit: 2017-11-25 | Discharge: 2017-11-25 | Disposition: A | Discharge status: Home / Self Care | Payer: PPO | Attending: Emergency Medicine | Admitting: Emergency Medicine

## 2017-11-25 DIAGNOSIS — M545 Low back pain, unspecified: Secondary | ICD-10-CM

## 2017-11-25 DIAGNOSIS — N410 Acute prostatitis: Secondary | ICD-10-CM

## 2017-11-25 LAB — URINALYSIS, COMPLETE (UACMP) WITH MICROSCOPIC
BACTERIA UA: NONE SEEN
BILIRUBIN URINE: NEGATIVE
Glucose, UA: NEGATIVE mg/dL
Hgb urine dipstick: NEGATIVE
Ketones, ur: NEGATIVE mg/dL
Leukocytes, UA: NEGATIVE
NITRITE: NEGATIVE
PH: 5.5 (ref 5.0–8.0)
Protein, ur: NEGATIVE mg/dL
RBC / HPF: NONE SEEN RBC/hpf (ref 0–5)
SPECIFIC GRAVITY, URINE: 1.01 (ref 1.005–1.030)
Squamous Epithelial / LPF: NONE SEEN (ref 0–5)

## 2017-11-25 LAB — CBC WITH DIFFERENTIAL/PLATELET
Abs Immature Granulocytes: 0.03 10*3/uL (ref 0.00–0.07)
BASOS ABS: 0.1 10*3/uL (ref 0.0–0.1)
Basophils Relative: 1 %
EOS ABS: 0.1 10*3/uL (ref 0.0–0.5)
EOS PCT: 2 %
HCT: 44.2 % (ref 39.0–52.0)
Hemoglobin: 15.7 g/dL (ref 13.0–17.0)
IMMATURE GRANULOCYTES: 0 %
LYMPHS PCT: 29 %
Lymphs Abs: 2 10*3/uL (ref 0.7–4.0)
MCH: 30.8 pg (ref 26.0–34.0)
MCHC: 35.5 g/dL (ref 30.0–36.0)
MCV: 86.7 fL (ref 80.0–100.0)
Monocytes Absolute: 0.4 10*3/uL (ref 0.1–1.0)
Monocytes Relative: 6 %
NEUTROS PCT: 62 %
NRBC: 0 % (ref 0.0–0.2)
Neutro Abs: 4.3 10*3/uL (ref 1.7–7.7)
Platelets: 129 10*3/uL — ABNORMAL LOW (ref 150–400)
RBC: 5.1 MIL/uL (ref 4.22–5.81)
RDW: 12.5 % (ref 11.5–15.5)
WBC: 7 10*3/uL (ref 4.0–10.5)

## 2017-11-25 LAB — BASIC METABOLIC PANEL
ANION GAP: 12 (ref 5–15)
BUN: 15 mg/dL (ref 8–23)
CALCIUM: 9.2 mg/dL (ref 8.9–10.3)
CHLORIDE: 99 mmol/L (ref 98–111)
CO2: 26 mmol/L (ref 22–32)
Creatinine, Ser: 1 mg/dL (ref 0.61–1.24)
GFR calc non Af Amer: >60 mL/min (ref >60)
Glucose, Bld: 110 mg/dL — ABNORMAL HIGH (ref 70–99)
Potassium: 4.1 mmol/L (ref 3.5–5.1)
SODIUM: 137 mmol/L (ref 135–145)

## 2017-11-25 MED ORDER — TAMSULOSIN HCL 0.4 MG PO CAPS: 0.4000 mg | ORAL_CAPSULE | Freq: Every day | ORAL | 0 refills | Status: AC

## 2017-11-25 MED ORDER — CIPROFLOXACIN HCL 500 MG PO TABS: 500.0000 mg | ORAL_TABLET | Freq: Two times a day (BID) | ORAL | 0 refills | Status: AC

## 2017-11-25 NOTE — ED Provider Notes (Signed)
HPI  SUBJECTIVE:  Mark Riggs is a 77 y.o. male who presents with 2 weeks of bilateral migratory low back pain that radiates into his hips, flank, groin.  It became constant 1 week ago.  He describes the pain as soreness and states that he has difficulty getting up secondary to the pain.  He reports difficulty completely emptying his bladder, states that he is dribbling urine for the past 2 to 3 weeks. denies N/V, fevers, abdominal pain, urinary urgency, frequency, dysuria, cloudy or odorous urine, hematuria.  No syncope. No saddle anesthesia, distal weakness/numbness, bilateral radicular leg pain, night sweats, recent h/o trauma, neurological deficits,  bladder/ bowel incontinence, urinary retention, unexplained weight loss, pain worse at night,  h/o prolonged steroid use, h/o osteopenia, h/o IVDU, h/o HIV, known AAA. no h/o pyelonephritis,. States feels different than previous episodes of back pain.    Past medical history of renal cancer which was surgically removed.  He did not receive chemo, radiation or nephrectomy.  He has a history of BPH, prediabetes, obstructing nephrolithiasis and is a former smoker.  XN:ATFT, Leona Carry, MD   Past Medical History:  Diagnosis Date  . BPH (benign prostatic hyperplasia)   . Hyperlipemia   . Hypertension   . Pre-diabetes   . Renal cancer ([place]) 2007   left  . Renal mass     Past Surgical History:  Procedure Laterality Date  . APPENDECTOMY    . RENAL MASS EXCISION  2008  . TRANSURETHRAL RESECTION OF PROSTATE     patient unsure about this surgery long time ago by Dr. Ernst Spell    History reviewed. No pertinent family history.  Social History   Tobacco Use  . Smoking status: Former Research scientist (life sciences)  . Smokeless tobacco: Never Used  Substance Use Topics  . Alcohol use: No    Alcohol/week: 0.0 standard drinks  . Drug use: No    No current facility-administered medications for this encounter.   Current Outpatient Medications:  .  ALPRAZolam (XANAX)  1 MG tablet, Take 1 mg by mouth 3 (three) times daily as needed for anxiety., Disp: , Rfl:  .  aspirin 81 MG tablet, Take 81 mg by mouth daily., Disp: , Rfl:  .  glimepiride (AMARYL) 2 MG tablet, Take 2 mg by mouth daily with breakfast., Disp: , Rfl:  .  lisinopril (PRINIVIL,ZESTRIL) 5 MG tablet, Take 5 mg by mouth daily., Disp: , Rfl:  .  simvastatin (ZOCOR) 40 MG tablet, Take 40 mg by mouth daily., Disp: , Rfl:  .  ciprofloxacin (CIPRO) 500 MG tablet, Take 1 tablet (500 mg total) by mouth 2 (two) times daily for 14 days., Disp: 28 tablet, Rfl: 0 .  tamsulosin (FLOMAX) 0.4 MG CAPS capsule, Take 1 capsule (0.4 mg total) by mouth at bedtime., Disp: 30 capsule, Rfl: 0  Allergies  Allergen Reactions  . Contrast Media [Iodinated Diagnostic Agents] Swelling  . Sulfa Antibiotics Swelling  . Tetracyclines & Related Other (See Comments)    unknown     ROS  As noted in HPI.   Physical Exam  BP 133/80 (BP Location: Left Arm)   Pulse 61   Temp 98.3 F (36.8 C) (Oral)   Resp 18   Ht 5\' 8"  (1.727 m)   Wt 83.9 kg   SpO2 100%   BMI 28.13 kg/m   Constitutional: Well developed, well nourished, no acute distress Eyes:  EOMI, conjunctiva normal bilaterally HENT: Normocephalic, atraumatic,mucus membranes moist Respiratory: Normal inspiratory effort Cardiovascular: Normal rate GI:  nondistended. + suprapubic tenderness, positive left flank tenderness, patient states that this is normal since his surgery renal cancer Musculoskeletal: no CVAT. - paralumbar tenderness,  - muscle spasm. No bony tenderness lumbar spine, SI joint, sacrum. Bilateral lower extremities  baseline ROM with intact DP  Pulses. No pain with int/ext rotation flex/extension hips bilaterally.  Pain is not affected with AROM against resistance.  SLR neg bilaterally. Sensation baseline light touch bilaterally for Pt, DTR's symmetric and intact bilaterally KJ , Motor symmetric bilateral 5/5 hip flexion, quadriceps, hamstrings, EHL,  foot dorsiflexion, foot plantarflexion, gait normal.  Pain aggravated with going from sitting to standing Rectal: Positive tender prostate.  Patient states that this aggravates his back pain. Neurologic: Alert & oriented x 3, no focal neuro deficits Psychiatric: Speech and behavior appropriate   ED Course   Medications - No data to display  Orders Placed This Encounter  Procedures  . Urinalysis, Complete w Microscopic    Standing Status:   Standing    Number of Occurrences:   1  . Basic metabolic panel    Standing Status:   Standing    Number of Occurrences:   1  . CBC with Differential    Standing Status:   Standing    Number of Occurrences:   1    Results for orders placed or performed during the hospital encounter of 11/25/17 (from the past 24 hour(s))  Urinalysis, Complete w Microscopic     Status: Abnormal   Collection Time: 11/25/17  1:17 PM  Result Value Ref Range   Color, Urine STRAW (A) YELLOW   APPearance CLEAR CLEAR   Specific Gravity, Urine 1.010 1.005 - 1.030   pH 5.5 5.0 - 8.0   Glucose, UA NEGATIVE NEGATIVE mg/dL   Hgb urine dipstick NEGATIVE NEGATIVE   Bilirubin Urine NEGATIVE NEGATIVE   Ketones, ur NEGATIVE NEGATIVE mg/dL   Protein, ur NEGATIVE NEGATIVE mg/dL   Nitrite NEGATIVE NEGATIVE   Leukocytes, UA NEGATIVE NEGATIVE   Squamous Epithelial / LPF NONE SEEN 0 - 5   WBC, UA 0-5 0 - 5 WBC/hpf   RBC / HPF NONE SEEN 0 - 5 RBC/hpf   Bacteria, UA NONE SEEN NONE SEEN  Basic metabolic panel     Status: Abnormal   Collection Time: 11/25/17  2:28 PM  Result Value Ref Range   Sodium 137 135 - 145 mmol/L   Potassium 4.1 3.5 - 5.1 mmol/L   Chloride 99 98 - 111 mmol/L   CO2 26 22 - 32 mmol/L   Glucose, Bld 110 (H) 70 - 99 mg/dL   BUN 15 8 - 23 mg/dL   Creatinine, Ser 1.00 0.61 - 1.24 mg/dL   Calcium 9.2 8.9 - 10.3 mg/dL   GFR calc non Af Amer >60 >60 mL/min   GFR calc Af Amer >60 >60 mL/min   Anion gap 12 5 - 15  CBC with Differential     Status: Abnormal    Collection Time: 11/25/17  2:28 PM  Result Value Ref Range   WBC 7.0 4.0 - 10.5 K/uL   RBC 5.10 4.22 - 5.81 MIL/uL   Hemoglobin 15.7 13.0 - 17.0 g/dL   HCT 44.2 39.0 - 52.0 %   MCV 86.7 80.0 - 100.0 fL   MCH 30.8 26.0 - 34.0 pg   MCHC 35.5 30.0 - 36.0 g/dL   RDW 12.5 11.5 - 15.5 %   Platelets 129 (L) 150 - 400 K/uL   nRBC 0.0 0.0 - 0.2 %  Neutrophils Relative % 62 %   Neutro Abs 4.3 1.7 - 7.7 K/uL   Lymphocytes Relative 29 %   Lymphs Abs 2.0 0.7 - 4.0 K/uL   Monocytes Relative 6 %   Monocytes Absolute 0.4 0.1 - 1.0 K/uL   Eosinophils Relative 2 %   Eosinophils Absolute 0.1 0.0 - 0.5 K/uL   Basophils Relative 1 %   Basophils Absolute 0.1 0.0 - 0.1 K/uL   Immature Granulocytes 0 %   Abs Immature Granulocytes 0.03 0.00 - 0.07 K/uL   No results found.  ED Clinical Impression  Acute bilateral low back pain without sciatica  Acute prostatitis   ED Assessment/Plan   UA negative for hematuria, UTI, bacteriuria, glucosuria.   We will check a CBC, BMP because it is been more than 6 months since his last kidney function as this would guide therapy.  He does have prostate tenderness and he states that this aggravated his back pain, so I suspect prostatitis.  Doubt nephrolithiasis given the clean urine.  Deferring imaging today.   Plan to send home with Flomax, Cipro 500 mg twice daily for 14 days, will advise 5 days 200-400 mg of ibuprofen combined with 500 mg of Tylenol together 3 or 4 times a day as needed for pain.  Advised him to follow-up with his primary care physician in several days, gave him strict ER return precautions.  Patient states that he has been on a medication that "helped me empty my bladder" in the past.  I suspect this was Flomax  Discussed labs, MDM, treatment plan, and plan for follow-up with patient. Discussed sn/sx that should prompt return to the ED. patient agrees with plan.   Meds ordered this encounter  Medications  . tamsulosin (FLOMAX) 0.4 MG  CAPS capsule    Sig: Take 1 capsule (0.4 mg total) by mouth at bedtime.    Dispense:  30 capsule    Refill:  0  . ciprofloxacin (CIPRO) 500 MG tablet    Sig: Take 1 tablet (500 mg total) by mouth 2 (two) times daily for 14 days.    Dispense:  28 tablet    Refill:  0    *This clinic note was created using [employer]. Therefore, there may be occasional mistakes despite careful proofreading.  ?    Melynda Ripple, MD 11/25/17 2154

## 2017-11-25 NOTE — ED Triage Notes (Signed)
Patient complains of low back pain that started 2 weeks ago after a trip to [place]. Patient states that he has been unable to move quickly.

## 2017-11-25 NOTE — Discharge Instructions (Addendum)
Try 200 to 400 mg of ibuprofen with 500 mg of Tylenol together as needed for pain.  Do not Take this for more than 5 days.  Try the flomax, finish the Cipro unless tells you to stop.  Go immediately to the ER for fevers above 100.4, if you cannot urinate, back pain not controlled with Tylenol or ibuprofen , for any of the signs and symptoms, or for any concerns

## 2017-12-01 ENCOUNTER — Other Ambulatory Visit: Payer: Self-pay

## 2017-12-01 ENCOUNTER — Emergency Department: Payer: PPO

## 2017-12-01 ENCOUNTER — Observation Stay
Admission: EM | Admit: 2017-12-01 | Discharge: 2017-12-03 | Disposition: A | Discharge status: Home / Self Care | Payer: PPO | Attending: Internal Medicine | Admitting: Internal Medicine

## 2017-12-01 ENCOUNTER — Encounter: Payer: Self-pay | Admitting: Emergency Medicine

## 2017-12-01 DIAGNOSIS — I10 Essential (primary) hypertension: Secondary | ICD-10-CM | POA: Diagnosis not present

## 2017-12-01 DIAGNOSIS — G8929 Other chronic pain: Secondary | ICD-10-CM | POA: Insufficient documentation

## 2017-12-01 DIAGNOSIS — Z7982 Long term (current) use of aspirin: Secondary | ICD-10-CM | POA: Diagnosis not present

## 2017-12-01 DIAGNOSIS — E785 Hyperlipidemia, unspecified: Secondary | ICD-10-CM | POA: Insufficient documentation

## 2017-12-01 DIAGNOSIS — M25552 Pain in left hip: Secondary | ICD-10-CM | POA: Diagnosis not present

## 2017-12-01 DIAGNOSIS — Z7984 Long term (current) use of oral hypoglycemic drugs: Secondary | ICD-10-CM | POA: Insufficient documentation

## 2017-12-01 DIAGNOSIS — M549 Dorsalgia, unspecified: Secondary | ICD-10-CM | POA: Diagnosis present

## 2017-12-01 DIAGNOSIS — N401 Enlarged prostate with lower urinary tract symptoms: Secondary | ICD-10-CM | POA: Insufficient documentation

## 2017-12-01 DIAGNOSIS — R251 Tremor, unspecified: Secondary | ICD-10-CM | POA: Diagnosis not present

## 2017-12-01 DIAGNOSIS — Z85528 Personal history of other malignant neoplasm of kidney: Secondary | ICD-10-CM | POA: Diagnosis not present

## 2017-12-01 DIAGNOSIS — R739 Hyperglycemia, unspecified: Secondary | ICD-10-CM | POA: Insufficient documentation

## 2017-12-01 DIAGNOSIS — R109 Unspecified abdominal pain: Secondary | ICD-10-CM | POA: Diagnosis present

## 2017-12-01 DIAGNOSIS — M545 Low back pain: Principal | ICD-10-CM | POA: Insufficient documentation

## 2017-12-01 DIAGNOSIS — Z87891 Personal history of nicotine dependence: Secondary | ICD-10-CM | POA: Diagnosis not present

## 2017-12-01 DIAGNOSIS — M48061 Spinal stenosis, lumbar region without neurogenic claudication: Secondary | ICD-10-CM | POA: Diagnosis not present

## 2017-12-01 DIAGNOSIS — K5730 Diverticulosis of large intestine without perforation or abscess without bleeding: Secondary | ICD-10-CM | POA: Diagnosis not present

## 2017-12-01 DIAGNOSIS — N200 Calculus of kidney: Secondary | ICD-10-CM | POA: Diagnosis not present

## 2017-12-01 DIAGNOSIS — R350 Frequency of micturition: Secondary | ICD-10-CM | POA: Insufficient documentation

## 2017-12-01 DIAGNOSIS — Z905 Acquired absence of kidney: Secondary | ICD-10-CM | POA: Insufficient documentation

## 2017-12-01 DIAGNOSIS — M5489 Other dorsalgia: Secondary | ICD-10-CM | POA: Diagnosis not present

## 2017-12-01 DIAGNOSIS — Z79899 Other long term (current) drug therapy: Secondary | ICD-10-CM | POA: Diagnosis not present

## 2017-12-01 DIAGNOSIS — R1030 Lower abdominal pain, unspecified: Secondary | ICD-10-CM | POA: Diagnosis not present

## 2017-12-01 LAB — CBC WITH DIFFERENTIAL/PLATELET
ABS IMMATURE GRANULOCYTES: 0.04 10*3/uL (ref 0.00–0.07)
BASOS ABS: 0.1 10*3/uL (ref 0.0–0.1)
BASOS PCT: 1 %
Eosinophils Absolute: 0.2 10*3/uL (ref 0.0–0.5)
Eosinophils Relative: 3 %
HCT: 41.6 % (ref 39.0–52.0)
Hemoglobin: 14.7 g/dL (ref 13.0–17.0)
IMMATURE GRANULOCYTES: 1 %
Lymphocytes Relative: 34 %
Lymphs Abs: 2.4 10*3/uL (ref 0.7–4.0)
MCH: 30.8 pg (ref 26.0–34.0)
MCHC: 35.3 g/dL (ref 30.0–36.0)
MCV: 87.2 fL (ref 80.0–100.0)
Monocytes Absolute: 0.5 10*3/uL (ref 0.1–1.0)
Monocytes Relative: 7 %
NEUTROS ABS: 3.7 10*3/uL (ref 1.7–7.7)
NEUTROS PCT: 54 %
NRBC: 0 % (ref 0.0–0.2)
Platelets: 141 10*3/uL — ABNORMAL LOW (ref 150–400)
RBC: 4.77 MIL/uL (ref 4.22–5.81)
RDW: 12.5 % (ref 11.5–15.5)
WBC: 6.9 10*3/uL (ref 4.0–10.5)

## 2017-12-01 LAB — URINALYSIS, COMPLETE (UACMP) WITH MICROSCOPIC
BACTERIA UA: NONE SEEN
Bilirubin Urine: NEGATIVE
Glucose, UA: NEGATIVE mg/dL
Hgb urine dipstick: NEGATIVE
Ketones, ur: NEGATIVE mg/dL
Leukocytes, UA: NEGATIVE
Nitrite: NEGATIVE
PROTEIN: NEGATIVE mg/dL
Specific Gravity, Urine: 1.015 (ref 1.005–1.030)
Squamous Epithelial / LPF: NONE SEEN (ref 0–5)
WBC UA: NONE SEEN WBC/hpf (ref 0–5)
pH: 6 (ref 5.0–8.0)

## 2017-12-01 LAB — HEPATIC FUNCTION PANEL
ALT: 22 U/L (ref 0–44)
AST: 19 U/L (ref 15–41)
Albumin: 4.4 g/dL (ref 3.5–5.0)
Alkaline Phosphatase: 69 U/L (ref 38–126)
BILIRUBIN INDIRECT: 0.5 mg/dL (ref 0.3–0.9)
Bilirubin, Direct: 0.2 mg/dL (ref 0.0–0.2)
TOTAL PROTEIN: 7.1 g/dL (ref 6.5–8.1)
Total Bilirubin: 0.7 mg/dL (ref 0.3–1.2)

## 2017-12-01 LAB — BASIC METABOLIC PANEL
ANION GAP: 8 (ref 5–15)
BUN: 21 mg/dL (ref 8–23)
CO2: 26 mmol/L (ref 22–32)
Calcium: 8.7 mg/dL — ABNORMAL LOW (ref 8.9–10.3)
Chloride: 106 mmol/L (ref 98–111)
Creatinine, Ser: 1.11 mg/dL (ref 0.61–1.24)
GFR calc Af Amer: >60 mL/min (ref >60)
Glucose, Bld: 159 mg/dL — ABNORMAL HIGH (ref 70–99)
POTASSIUM: 4.2 mmol/L (ref 3.5–5.1)
SODIUM: 140 mmol/L (ref 135–145)

## 2017-12-01 LAB — LIPASE, BLOOD: Lipase: 36 U/L (ref 11–51)

## 2017-12-01 MED ORDER — SODIUM CHLORIDE 0.9 % IV BOLUS
1000.0000 mL | Freq: Once | INTRAVENOUS | Status: AC
  Administered 2017-12-01: 1000 mL via INTRAVENOUS

## 2017-12-01 MED ORDER — ONDANSETRON HCL 4 MG/2ML IJ SOLN
4.0000 mg | Freq: Once | INTRAMUSCULAR | Status: AC
  Administered 2017-12-01: 4 mg via INTRAVENOUS
  Filled 2017-12-01: qty 2

## 2017-12-01 MED ORDER — DIPHENHYDRAMINE HCL 50 MG/ML IJ SOLN
50.0000 mg | Freq: Once | INTRAMUSCULAR | Status: AC
  Administered 2017-12-02: 50 mg via INTRAVENOUS
  Filled 2017-12-01: qty 1

## 2017-12-01 MED ORDER — LISINOPRIL 5 MG PO TABS
5.0000 mg | ORAL_TABLET | Freq: Once | ORAL | Status: AC
  Administered 2017-12-01: 5 mg via ORAL
  Filled 2017-12-01: qty 1

## 2017-12-01 MED ORDER — HYDROCORTISONE NA SUCCINATE PF 250 MG IJ SOLR
200.0000 mg | Freq: Once | INTRAMUSCULAR | Status: AC
  Administered 2017-12-01: 200 mg via INTRAVENOUS

## 2017-12-01 MED ORDER — ONDANSETRON HCL 4 MG/2ML IJ SOLN
4.0000 mg | Freq: Once | INTRAMUSCULAR | Status: AC
  Administered 2017-12-01: 4 mg via INTRAVENOUS

## 2017-12-01 MED ORDER — DIPHENHYDRAMINE HCL 25 MG PO CAPS
50.0000 mg | ORAL_CAPSULE | Freq: Once | ORAL | Status: AC
  Filled 2017-12-01: qty 2

## 2017-12-01 MED ORDER — FENTANYL CITRATE (PF) 100 MCG/2ML IJ SOLN
50.0000 ug | Freq: Once | INTRAMUSCULAR | Status: AC
  Administered 2017-12-01: 50 ug via INTRAVENOUS
  Filled 2017-12-01: qty 2

## 2017-12-01 MED ORDER — HYDROCORTISONE NA SUCCINATE PF 100 MG IJ SOLR
INTRAMUSCULAR | Status: AC
  Filled 2017-12-01: qty 4

## 2017-12-01 MED ORDER — ONDANSETRON HCL 4 MG/2ML IJ SOLN
INTRAMUSCULAR | Status: AC
  Filled 2017-12-01: qty 2

## 2017-12-01 NOTE — ED Notes (Signed)
md in to speak with pt and family. 

## 2017-12-01 NOTE — ED Provider Notes (Signed)
[facility] Emergency Department Provider Note  ____________________________________________  Time seen: Approximately 11:13 PM  I have reviewed the triage vital signs and the nursing notes.   HISTORY  Chief Complaint Back Pain   HPI Mark Riggs is a 77 y.o. male with a history of left renal cancer status post resection, hypertension, hyperlipidemia, and BPH who presents for evaluation of left-sided flank pain.  Patient reports that the pain has been ongoing for 3 weeks however over the last few days has become severe.  Today has been unable to walk due to the severity of the pain.  The pain is not midline, no saddle anesthesia, weakness or numbness of his extremities, urinary or bowel incontinence or retention.  He does complain of increased urinary frequency.  He denies abdominal pain.  He reports that the pain in his flank is dull, constant, nonradiating, currently 8 out of 10.  No trauma.  No rash.  No fever or chills, no chest pain or shortness of breath, no constipation or diarrhea, no nausea or vomiting.  Patient was seen for similar symptoms in urgent care 6 days ago and he was told that he had prostatitis.  He was put on antibiotics.  He reports that initially he did feel better however his symptoms got worse.   Past Medical History:  Diagnosis Date  . BPH (benign prostatic hyperplasia)   . Hyperlipemia   . Hypertension   . Pre-diabetes   . Renal cancer ([place]) 2007   left  . Renal mass    Past Surgical History:  Procedure Laterality Date  . APPENDECTOMY    . RENAL MASS EXCISION  2008  . TRANSURETHRAL RESECTION OF PROSTATE     patient unsure about this surgery long time ago by Dr. Ernst Spell    Prior to Admission medications   Medication Sig Start Date End Date Taking? Authorizing Provider  ALPRAZolam Duanne Moron) 1 MG tablet Take 1 mg by mouth 3 (three) times daily as needed for anxiety.    [provider]  aspirin 81 MG tablet Take 81 mg  by mouth daily.    [provider]  ciprofloxacin (CIPRO) 500 MG tablet Take 1 tablet (500 mg total) by mouth 2 (two) times daily for 14 days. 11/25/17 12/09/17  Melynda Ripple, MD  glimepiride (AMARYL) 2 MG tablet Take 2 mg by mouth daily with breakfast.    [provider]  lisinopril (PRINIVIL,ZESTRIL) 5 MG tablet Take 5 mg by mouth daily.    [provider]  simvastatin (ZOCOR) 40 MG tablet Take 40 mg by mouth daily.    [provider]  tamsulosin (FLOMAX) 0.4 MG CAPS capsule Take 1 capsule (0.4 mg total) by mouth at bedtime. 11/25/17 12/25/17  Melynda Ripple, MD    Allergies Contrast media [iodinated diagnostic agents]; Sulfa antibiotics; and Tetracyclines & related  History reviewed. No pertinent family history.  Social History Social History   Tobacco Use  . Smoking status: Former Research scientist (life sciences)  . Smokeless tobacco: Never Used  Substance Use Topics  . Alcohol use: No    Alcohol/week: 0.0 standard drinks  . Drug use: No    Review of Systems  Constitutional: Negative for fever. Eyes: Negative for visual changes. ENT: Negative for sore throat. Neck: No neck pain  Cardiovascular: Negative for chest pain. Respiratory: Negative for shortness of breath. Gastrointestinal: Negative for abdominal pain, vomiting or diarrhea. Genitourinary: Negative for dysuria. + left flank pain and frequency Musculoskeletal: Negative for back pain. Skin: Negative  for rash. Neurological: Negative for headaches, weakness or numbness. Psych: No SI or HI  ____________________________________________   PHYSICAL EXAM:  VITAL SIGNS: ED Triage Vitals  Enc Vitals Group     BP 12/01/17 2037 (!) 170/90     Pulse Rate 12/01/17 2037 76     Resp 12/01/17 2037 18     Temp --      Temp Source 12/01/17 2037 Oral     SpO2 12/01/17 2037 95 %     Weight --      Height --      Head Circumference --      Peak Flow --      Pain Score 12/01/17 2035 9     Pain Loc --        Pain Edu? --      Excl. in [place]? --     Constitutional: Alert and oriented. Well appearing and in no apparent distress. HEENT:      Head: Normocephalic and atraumatic.         Eyes: Conjunctivae are normal. Sclera is non-icteric.       Mouth/Throat: Mucous membranes are moist.       Neck: Supple with no signs of meningismus. Cardiovascular: Regular rate and rhythm. No murmurs, gallops, or rubs. 2+ symmetrical distal pulses are present in all extremities. No JVD. Respiratory: Normal respiratory effort. Lungs are clear to auscultation bilaterally. No wheezes, crackles, or rhonchi.  Gastrointestinal: Soft, non tender, and non distended with positive bowel sounds. No rebound or guarding. Genitourinary: No CVA tenderness. Normal rectal tone Musculoskeletal: Nontender with normal range of motion in all extremities. No edema, cyanosis, or erythema of extremities.  No midline CT and L-spine tenderness Neurologic: Normal speech and language. Face is symmetric.  Intact strength and sensation in bilateral lower extremities, intact brisk reflexes on bilateral patella. Skin: Skin is warm, dry and intact. No rash noted. Psychiatric: Mood and affect are normal. Speech and behavior are normal.  ____________________________________________   LABS (all labs ordered are listed, but only abnormal results are displayed)  Labs Reviewed  URINALYSIS, COMPLETE (UACMP) WITH MICROSCOPIC - Abnormal; Notable for the following components:      Result Value   Color, Urine YELLOW (*)    APPearance CLEAR (*)    All other components within normal limits  CBC WITH DIFFERENTIAL/PLATELET - Abnormal; Notable for the following components:   Platelets 141 (*)    All other components within normal limits  BASIC METABOLIC PANEL - Abnormal; Notable for the following components:   Glucose, Bld 159 (*)    Calcium 8.7 (*)    All other components within normal limits  HEPATIC FUNCTION PANEL  LIPASE, BLOOD  I-STAT CG4  LACTIC ACID, ED   ____________________________________________  EKG  none  ____________________________________________  RADIOLOGY  I have personally reviewed the images performed during this visit and I agree with the Radiologist's read.   Interpretation by Radiologist:  Ct Renal Stone Study  Result Date: 12/01/2017 CLINICAL DATA:  Left-sided back pain. Left CVA tenderness on exam. Recently diagnosed prostatitis. EXAM: CT ABDOMEN AND PELVIS WITHOUT CONTRAST TECHNIQUE: Multidetector CT imaging of the abdomen and pelvis was performed following the standard protocol without IV contrast. COMPARISON:  02/06/2007 FINDINGS: Lower chest: No acute findings. Hepatobiliary: No mass visualized on this unenhanced exam. Tiny gallstones versus gallbladder sludge. No evidence of cholecystitis or biliary ductal dilatation. Pancreas: No mass or inflammatory process visualized on this unenhanced exam. Spleen:  Within normal limits in size. Adrenals/Urinary tract:  A few tiny 1-2 mm calculi are seen in the upper pole of the left kidney. No evidence of ureteral calculi or hydronephrosis. Several fluid attenuation renal cysts again noted bilaterally. Unremarkable unopacified urinary bladder. Stomach/Bowel: Small hiatal hernia. No evidence of obstruction, inflammatory process, or abnormal fluid collections. Mild diverticulosis is seen mainly involving the sigmoid colon, however there is no evidence of diverticulitis. Vascular/Lymphatic: No pathologically enlarged lymph nodes identified. No evidence of abdominal aortic aneurysm. Aortic atherosclerosis. Reproductive:  No mass or other significant abnormality. Other:  None. Musculoskeletal:  No suspicious bone lesions identified. IMPRESSION: Tiny left renal calculi. No evidence of ureteral calculi, hydronephrosis, or other acute findings. Tiny gallstones versus gallbladder sludge. No radiographic evidence of cholecystitis or biliary dilatation. Small hiatal hernia. Mild  sigmoid diverticulosis, without radiographic evidence of diverticulitis. Electronically Signed   By: Earle Gell M.D.   On: 12/01/2017 21:58     ____________________________________________   PROCEDURES  Procedure(s) performed: None Procedures Critical Care performed:  None ____________________________________________   INITIAL IMPRESSION / ASSESSMENT AND PLAN / ED COURSE  77 y.o. male with a history of left renal cancer status post resection, hypertension, hyperlipidemia, and BPH who presents for evaluation of left-sided flank pain.  Patient is well-appearing, he is hypertensive but other vitals are within normal limits, he has no flank tenderness, no abdominal tenderness, neurologically intact otherwise with brisk distal pulses.  With a history of prior renal cancer and resection we will send patient for CT renal to rule out recurrence of the cancer, hydronephrosis, kidney stone.  Will check urine to rule out pyelonephritis.  Will check basic labs.  Will give fentanyl and Zofran for symptom relief.  Clinical Course as of Dec 02 2318  Nancy Fetter Dec 01, 2017  2311 CT renal negative for acute pathology. Patient will undergo prep for CTA of a/p due to prior h/o allergic rxn to rule out mesenteric ischemia vs dissection. BP remains elevated.  Patient has not taken his daily antihypertensive.  Will give it to him now.  Patient continues to have significant pain and difficulty ambulating to and from the toilet without assistance.  He has no signs of cauda equina with normal rectal tone, no midline tenderness, intact strength, sensation, and brisk bilateral DTRs. Care transferred to Dr. Mable Paris   [CV]    Clinical Course User Index [CV] Alfred Levins [place], MD     As part of my medical decision making, I reviewed the following data within the [facility] [place] notes reviewed and incorporated, Labs reviewed  Old chart reviewed, Radiograph reviewed , Notes from prior ED visits and [place]  Controlled Substance Database    Pertinent labs & imaging results that were available during my care of the patient were reviewed by me and considered in my medical decision making (see chart for details).    ____________________________________________   FINAL CLINICAL IMPRESSION(S) / ED DIAGNOSES  Final diagnoses:  Left flank pain      NEW MEDICATIONS STARTED DURING THIS VISIT:  ED Discharge Orders    None       Note:  This document was prepared using Dragon voice recognition software and may include unintentional dictation errors.    Rudene Re, MD 12/01/17 [phone]

## 2017-12-01 NOTE — ED Notes (Signed)
Assessment: pt with left sided back pain that radiates to left flank. Pt states has been painful for about a week, progressively getting worse. Pt states he feels like he has to urinate but is not able to do so without " a dribble". Per laurie, rn pt with bladder scan in triage of 20mL. Pt denies fever. Pt does appear uncomfortable when back touched lightly and with movement.

## 2017-12-01 NOTE — ED Notes (Signed)
Pt pale and diaphoretic post transfer back from ct scan. Pt states pain is getting better but is nauseated. Order for additional zofran received.

## 2017-12-01 NOTE — ED Triage Notes (Addendum)
Pt says he was diagnosed with prostatitis Monday at urgent care; pt here tonight with increased left mid back pain; history of partial nephrectomy due to cancer about 11 years ago; pt says when he voids "I just dribble"; unsure if he feels like his bladder is emptying or not; unsure if running fever or not; pt says before today, when he would sit, the pain would ease off; now the pain is all the time; having a hard time walking due to the pain; tender on exam to left mid back;

## 2017-12-01 NOTE — ED Notes (Signed)
Color improving.

## 2017-12-01 NOTE — ED Notes (Signed)
Pt repositioned in bed, pt states he needs to urinate, up to commode. Pt verbalizes that he has not had his nighttime blood pressure medication or xanax.

## 2017-12-01 NOTE — ED Notes (Signed)
While assisting pt back from restroom to bed, pt states "I feel like my legs aren't communicating to my head". Pt is having difficulty ambulating, feet are taping floor. md notified.

## 2017-12-02 ENCOUNTER — Other Ambulatory Visit: Payer: Self-pay

## 2017-12-02 ENCOUNTER — Observation Stay: Payer: PPO

## 2017-12-02 ENCOUNTER — Emergency Department: Payer: PPO

## 2017-12-02 ENCOUNTER — Encounter: Payer: Self-pay | Admitting: Radiology

## 2017-12-02 DIAGNOSIS — M5126 Other intervertebral disc displacement, lumbar region: Secondary | ICD-10-CM | POA: Diagnosis not present

## 2017-12-02 DIAGNOSIS — M545 Low back pain: Secondary | ICD-10-CM | POA: Diagnosis not present

## 2017-12-02 DIAGNOSIS — M25552 Pain in left hip: Secondary | ICD-10-CM | POA: Diagnosis not present

## 2017-12-02 DIAGNOSIS — N200 Calculus of kidney: Secondary | ICD-10-CM | POA: Diagnosis not present

## 2017-12-02 DIAGNOSIS — R262 Difficulty in walking, not elsewhere classified: Secondary | ICD-10-CM | POA: Diagnosis not present

## 2017-12-02 DIAGNOSIS — R109 Unspecified abdominal pain: Secondary | ICD-10-CM | POA: Diagnosis present

## 2017-12-02 DIAGNOSIS — R29898 Other symptoms and signs involving the musculoskeletal system: Secondary | ICD-10-CM | POA: Diagnosis not present

## 2017-12-02 DIAGNOSIS — M48061 Spinal stenosis, lumbar region without neurogenic claudication: Secondary | ICD-10-CM | POA: Diagnosis not present

## 2017-12-02 LAB — GLUCOSE, CAPILLARY
GLUCOSE-CAPILLARY: 193 mg/dL — AB (ref 70–99)
GLUCOSE-CAPILLARY: 184 mg/dL — AB (ref 70–99)
GLUCOSE-CAPILLARY: 137 mg/dL — AB (ref 70–99)
Glucose-Capillary: 110 mg/dL — ABNORMAL HIGH (ref 70–99)
Glucose-Capillary: 119 mg/dL — ABNORMAL HIGH (ref 70–99)

## 2017-12-02 LAB — MAGNESIUM: MAGNESIUM: 2.1 mg/dL (ref 1.7–2.4)

## 2017-12-02 LAB — HEMOGLOBIN A1C
Hgb A1c MFr Bld: 5.6 % (ref 4.8–5.6)
Mean Plasma Glucose: 114.02 mg/dL

## 2017-12-02 LAB — PHOSPHORUS: PHOSPHORUS: 3.1 mg/dL (ref 2.5–4.6)

## 2017-12-02 LAB — CG4 I-STAT (LACTIC ACID): LACTIC ACID, VENOUS: 0.84 mmol/L (ref 0.5–1.9)

## 2017-12-02 MED ORDER — BISACODYL 5 MG PO TBEC
5.0000 mg | DELAYED_RELEASE_TABLET | Freq: Every day | ORAL | Status: DC | PRN
  Administered 2017-12-03: 5 mg via ORAL
  Filled 2017-12-02: qty 1

## 2017-12-02 MED ORDER — INSULIN ASPART 100 UNIT/ML ~~LOC~~ SOLN: 0.0000 [IU] | Freq: Every day | SUBCUTANEOUS | Status: DC

## 2017-12-02 MED ORDER — LISINOPRIL 5 MG PO TABS
5.0000 mg | ORAL_TABLET | Freq: Every day | ORAL | Status: DC
  Administered 2017-12-02: 09:00:00 5 mg via ORAL
  Filled 2017-12-02: qty 1

## 2017-12-02 MED ORDER — ACETAMINOPHEN 325 MG PO TABS: 650.0000 mg | ORAL_TABLET | Freq: Four times a day (QID) | ORAL | Status: DC | PRN

## 2017-12-02 MED ORDER — ALPRAZOLAM 1 MG PO TABS
1.0000 mg | ORAL_TABLET | Freq: Three times a day (TID) | ORAL | Status: DC | PRN
  Administered 2017-12-02 – 2017-12-03 (×3): 1 mg via ORAL
  Filled 2017-12-02 (×3): qty 1

## 2017-12-02 MED ORDER — LORAZEPAM 2 MG/ML IJ SOLN: 1.0000 mg | Freq: Once | INTRAMUSCULAR | Status: DC

## 2017-12-02 MED ORDER — ONDANSETRON HCL 4 MG/2ML IJ SOLN: 4.0000 mg | Freq: Once | INTRAMUSCULAR | Status: DC

## 2017-12-02 MED ORDER — TAMSULOSIN HCL 0.4 MG PO CAPS
0.4000 mg | ORAL_CAPSULE | Freq: Every day | ORAL | Status: DC
  Administered 2017-12-02: 22:00:00 0.4 mg via ORAL
  Filled 2017-12-02: qty 1

## 2017-12-02 MED ORDER — SIMVASTATIN 20 MG PO TABS
40.0000 mg | ORAL_TABLET | Freq: Every day | ORAL | Status: DC
  Administered 2017-12-02: 40 mg via ORAL
  Filled 2017-12-02: qty 2

## 2017-12-02 MED ORDER — DIAZEPAM 5 MG PO TABS: 5.0000 mg | ORAL_TABLET | Freq: Four times a day (QID) | ORAL | Status: DC | PRN

## 2017-12-02 MED ORDER — LISINOPRIL 5 MG PO TABS
5.0000 mg | ORAL_TABLET | Freq: Once | ORAL | Status: AC
  Administered 2017-12-02: 5 mg via ORAL
  Filled 2017-12-02: qty 1

## 2017-12-02 MED ORDER — SENNOSIDES-DOCUSATE SODIUM 8.6-50 MG PO TABS: 1.0000 | ORAL_TABLET | Freq: Every evening | ORAL | Status: DC | PRN

## 2017-12-02 MED ORDER — IOPAMIDOL (ISOVUE-370) INJECTION 76%
100.0000 mL | Freq: Once | INTRAVENOUS | Status: AC | PRN
  Administered 2017-12-02: 100 mL via INTRAVENOUS

## 2017-12-02 MED ORDER — LORAZEPAM 2 MG/ML IJ SOLN
1.0000 mg | Freq: Once | INTRAMUSCULAR | Status: AC
  Administered 2017-12-02: 13:00:00 1 mg via INTRAVENOUS
  Filled 2017-12-02: qty 1

## 2017-12-02 MED ORDER — ONDANSETRON HCL 4 MG PO TABS: 4.0000 mg | ORAL_TABLET | Freq: Four times a day (QID) | ORAL | Status: DC | PRN

## 2017-12-02 MED ORDER — HYDROMORPHONE HCL 1 MG/ML IJ SOLN
0.5000 mg | INTRAMUSCULAR | Status: DC | PRN
  Administered 2017-12-02 (×2): 0.5 mg via INTRAVENOUS
  Filled 2017-12-02 (×2): qty 1

## 2017-12-02 MED ORDER — LORAZEPAM 2 MG/ML IJ SOLN
1.0000 mg | Freq: Once | INTRAMUSCULAR | Status: AC
  Administered 2017-12-02: 09:00:00 1 mg via INTRAVENOUS
  Filled 2017-12-02: qty 1

## 2017-12-02 MED ORDER — ENOXAPARIN SODIUM 40 MG/0.4ML ~~LOC~~ SOLN
40.0000 mg | SUBCUTANEOUS | Status: DC
  Administered 2017-12-02 – 2017-12-03 (×2): 40 mg via SUBCUTANEOUS
  Filled 2017-12-02 (×2): qty 0.4

## 2017-12-02 MED ORDER — ONDANSETRON HCL 4 MG/2ML IJ SOLN: 4.0000 mg | Freq: Four times a day (QID) | INTRAMUSCULAR | Status: DC | PRN

## 2017-12-02 MED ORDER — GABAPENTIN 300 MG PO CAPS
600.0000 mg | ORAL_CAPSULE | Freq: Once | ORAL | Status: AC
  Administered 2017-12-02: 600 mg via ORAL
  Filled 2017-12-02: qty 2

## 2017-12-02 MED ORDER — FENTANYL CITRATE (PF) 100 MCG/2ML IJ SOLN: 100.0000 ug | Freq: Once | INTRAMUSCULAR | Status: DC

## 2017-12-02 MED ORDER — LISINOPRIL 10 MG PO TABS
10.0000 mg | ORAL_TABLET | Freq: Every day | ORAL | Status: DC
  Administered 2017-12-03: 10 mg via ORAL
  Filled 2017-12-02: qty 1

## 2017-12-02 MED ORDER — ASPIRIN EC 81 MG PO TBEC
81.0000 mg | DELAYED_RELEASE_TABLET | Freq: Every day | ORAL | Status: DC
  Administered 2017-12-02 – 2017-12-03 (×2): 81 mg via ORAL
  Filled 2017-12-02 (×2): qty 1

## 2017-12-02 MED ORDER — ACETAMINOPHEN 650 MG RE SUPP: 650.0000 mg | Freq: Four times a day (QID) | RECTAL | Status: DC | PRN

## 2017-12-02 MED ORDER — INSULIN ASPART 100 UNIT/ML ~~LOC~~ SOLN
0.0000 [IU] | Freq: Three times a day (TID) | SUBCUTANEOUS | Status: DC
  Administered 2017-12-02: 18:00:00 1 [IU] via SUBCUTANEOUS
  Administered 2017-12-02: 2 [IU] via SUBCUTANEOUS
  Administered 2017-12-03: 1 [IU] via SUBCUTANEOUS
  Filled 2017-12-02 (×3): qty 1

## 2017-12-02 NOTE — ED Notes (Addendum)
Answered pts call bell. Pt requesting to void. Pt assisted to toilet and able to void. Pt back in bed. Pts call bell placed within reach.

## 2017-12-02 NOTE — ED Provider Notes (Signed)
CTA neg.  Unable to ambulate.  Requiring a second dose of IV fentanyl now.  Will admit for pain control and to continue workup.   Darel Hong, MD 12/02/17 [phone]

## 2017-12-02 NOTE — ED Notes (Signed)
hospitalist in to see pt.

## 2017-12-02 NOTE — ED Notes (Addendum)
Patient up to toilet. Patient placed back in bed. Patient provided phone to call spouse

## 2017-12-02 NOTE — ED Notes (Signed)
Answered pts call bell. Pt requesting to use the bathroom. This tech moved bed over to toilet and assisted pt to get up and use the bathroom. Pt able to stand but unsteady. After urination pt was helped back into the bed and call bell was placed within reach.

## 2017-12-02 NOTE — Progress Notes (Signed)
PT Cancellation Note  Patient Details Name: Mark Riggs MRN: [id number] DOB: 04-Sep-1940   Cancelled Treatment:    Reason Eval/Treat Not Completed: Other (comment)(pt pending MRI of lumbar spine).  Will hold PT until imaging complete and results are back.  Will continue to follow pt acutely.   Collie Siad PT, DPT 12/02/2017, 1:24 PM

## 2017-12-02 NOTE — ED Notes (Signed)
Pt assisted up to commode. 

## 2017-12-02 NOTE — Care Management Note (Signed)
Case Management Note  Patient Details  Name: Mark Riggs MRN: [id number] Date of Birth: 1940/01/11  Subjective/Objective:  Admitted to [facility] under observation status with the diagnosis of left flank pain. Lives with wife, Mark Riggs [phone]). Last seen Dr. Hall Busing August 22 2017. Prescriptions are filled at [facility] in [place].  No home health, No skilled facility. No home oxygen, No medical equipment in the home. Takes care of all basic activities of daily living himself, drives. No falls. Good appetite. Family will transport                  Action/Plan: No discharge needs identified at this time   Expected Discharge Date:                  Expected Discharge Plan:     In-House Referral:   yes  Discharge planning Services   yes  Post Acute Care Choice:    Choice offered to:     DME Arranged:    DME Agency:     HH Arranged:    [place] Agency:     Status of Service:     If discussed at [employer], dates discussed:    Additional Comments:  Shelbie Ammons, RN MSN CCM Care Management [phone] 12/02/2017, 9:42 AM

## 2017-12-02 NOTE — Progress Notes (Signed)
[facility] [place] NAME: Mark Riggs    MR#:  [id number]  DATE OF BIRTH:  1940-10-16  SUBJECTIVE:  CHIEF COMPLAINT:   Chief Complaint  Patient presents with  . Back Pain  Seen and evaluated today Has back pain Also has some shooting pain radiating down the left lower extremity on the posterior side No bowel bladder incontinence No numbness in the lower extremities No dysphagia or slurred speech  REVIEW OF SYSTEMS:    ROS  CONSTITUTIONAL: No documented fever. No fatigue, weakness. No weight gain, no weight loss.  EYES: No blurry or double vision.  ENT: No tinnitus. No postnasal drip. No redness of the oropharynx.  RESPIRATORY: No cough, no wheeze, no hemoptysis. No dyspnea.  CARDIOVASCULAR: No chest pain. No orthopnea. No palpitations. No syncope.  GASTROINTESTINAL: No nausea, no vomiting or diarrhea. No abdominal pain. No melena or hematochezia.  GENITOURINARY: No dysuria or hematuria.  ENDOCRINE: No polyuria or nocturia. No heat or cold intolerance.  HEMATOLOGY: No anemia. No bruising. No bleeding.  INTEGUMENTARY: No rashes. No lesions.  MUSCULOSKELETAL: No arthritis. No swelling. No gout.  Back pain present NEUROLOGIC: No numbness, tingling, or ataxia. No seizure-type activity.  PSYCHIATRIC: No anxiety. No insomnia. No ADD.   DRUG ALLERGIES:   Allergies  Allergen Reactions  . Contrast Media [Iodinated Diagnostic Agents] Swelling  . Sulfa Antibiotics Swelling  . Tetracyclines & Related Other (See Comments)    unknown    VITALS:  Blood pressure (!) 142/86, pulse 100, temperature (!) 97.5 F (36.4 C), temperature source Oral, resp. rate 16, height 5\' 9"  (1.753 m), weight 84.2 kg, SpO2 96 %.  PHYSICAL EXAMINATION:   Physical Exam  GENERAL:  77 y.o.-year-old patient lying in the bed with no acute distress.  EYES: Pupils equal, round, reactive to light and accommodation. No scleral icterus. Extraocular muscles intact.   HEENT: Head atraumatic, normocephalic. Oropharynx and nasopharynx clear.  NECK:  Supple, no jugular venous distention. No thyroid enlargement, no tenderness.  LUNGS: Normal breath sounds bilaterally, no wheezing, rales, rhonchi. No use of accessory muscles of respiration.  CARDIOVASCULAR: S1, S2 normal. No murmurs, rubs, or gallops.  ABDOMEN: Soft, nontender, nondistended. Bowel sounds present. No organomegaly or mass.  EXTREMITIES: No cyanosis, clubbing or edema b/l.    Tenderness noted in the lower back NEUROLOGIC: Cranial nerves II through XII are intact. No focal Motor or sensory deficits b/l.   PSYCHIATRIC: The patient is alert and oriented x 3.  SKIN: No obvious rash, lesion, or ulcer.   LABORATORY PANEL:   CBC Recent Labs  Lab 12/01/17 2116  WBC 6.9  HGB 14.7  HCT 41.6  PLT 141*   ------------------------------------------------------------------------------------------------------------------ Chemistries  Recent Labs  Lab 12/01/17 2116  NA 140  K 4.2  CL 106  CO2 26  GLUCOSE 159*  BUN 21  CREATININE 1.11  CALCIUM 8.7*  MG 2.1  AST 19  ALT 22  ALKPHOS 69  BILITOT 0.7   ------------------------------------------------------------------------------------------------------------------  Cardiac Enzymes No results for input(s): TROPONINI in the last 168 hours. ------------------------------------------------------------------------------------------------------------------  RADIOLOGY:  Ct Renal Stone Study  Result Date: 12/01/2017 CLINICAL DATA:  Left-sided back pain. Left CVA tenderness on exam. Recently diagnosed prostatitis. EXAM: CT ABDOMEN AND PELVIS WITHOUT CONTRAST TECHNIQUE: Multidetector CT imaging of the abdomen and pelvis was performed following the standard protocol without IV contrast. COMPARISON:  02/06/2007 FINDINGS: Lower chest: No acute findings. Hepatobiliary: No mass visualized on this unenhanced exam. Tiny gallstones versus gallbladder  sludge. No evidence of cholecystitis or biliary ductal dilatation. Pancreas: No mass or inflammatory process visualized on this unenhanced exam. Spleen:  Within normal limits in size. Adrenals/Urinary tract: A few tiny 1-2 mm calculi are seen in the upper pole of the left kidney. No evidence of ureteral calculi or hydronephrosis. Several fluid attenuation renal cysts again noted bilaterally. Unremarkable unopacified urinary bladder. Stomach/Bowel: Small hiatal hernia. No evidence of obstruction, inflammatory process, or abnormal fluid collections. Mild diverticulosis is seen mainly involving the sigmoid colon, however there is no evidence of diverticulitis. Vascular/Lymphatic: No pathologically enlarged lymph nodes identified. No evidence of abdominal aortic aneurysm. Aortic atherosclerosis. Reproductive:  No mass or other significant abnormality. Other:  None. Musculoskeletal:  No suspicious bone lesions identified. IMPRESSION: Tiny left renal calculi. No evidence of ureteral calculi, hydronephrosis, or other acute findings. Tiny gallstones versus gallbladder sludge. No radiographic evidence of cholecystitis or biliary dilatation. Small hiatal hernia. Mild sigmoid diverticulosis, without radiographic evidence of diverticulitis. Electronically Signed   By: Earle Gell M.D.   On: 12/01/2017 21:58   Ct Angio Abd/pel W And/or Wo Contrast  Result Date: 12/02/2017 CLINICAL DATA:  Flank pain.  Evaluate for dissection. EXAM: CTA ABDOMEN AND PELVIS wITHOUT AND WITH CONTRAST TECHNIQUE: Multidetector CT imaging of the abdomen and pelvis was performed using the standard protocol during bolus administration of intravenous contrast. Multiplanar reconstructed images and MIPs were obtained and reviewed to evaluate the vascular anatomy. CONTRAST:  183mL ISOVUE-370 IOPAMIDOL (ISOVUE-370) INJECTION 76% COMPARISON:  Noncontrast CT 6 hours prior. FINDINGS: VASCULAR Aorta: Normal caliber aorta without aneurysm, dissection,  vasculitis or significant stenosis. Mild to moderate atherosclerotic plaque. Celiac: Patent without evidence of aneurysm, dissection, vasculitis or significant stenosis. SMA: Patent without evidence of aneurysm, dissection, vasculitis or significant stenosis. Renals: Both renal arteries are patent without evidence of aneurysm, dissection, vasculitis, fibromuscular dysplasia or significant stenosis. Mild atherosclerosis at the origin, left greater than right. IMA: Patent without evidence of aneurysm, dissection, vasculitis or significant stenosis. Inflow: Patent without evidence of aneurysm, dissection, vasculitis or significant stenosis. Proximal Outflow: Bilateral common femoral and visualized portions of the superficial and profunda femoral arteries are patent without evidence of aneurysm, dissection, vasculitis or significant stenosis. Veins: The IVC, portal and mesenteric veins are patent. Review of the MIP images confirms the above findings. NON-VASCULAR Lower chest: No acute findings. Hepatobiliary: No focal hepatic abnormality. Minimal layering sludge versus small stones in the gallbladder, unchanged from prior. No pericholecystic inflammation. Pancreas: No ductal dilatation or inflammation. Spleen: Normal in size without focal abnormality. Adrenals/Urinary Tract: Normal adrenal glands. No hydronephrosis. Mild symmetric bilateral perinephric edema. Tiny nonobstructing stone in the left kidney on prior noncontrast exam are not as well seen in the presence of IV contrast. Bilateral renal cysts. Scarring in the posterior left kidney. Urinary bladder is partially distended. No bladder wall thickening. Stomach/Bowel: Mild colonic diverticulosis without diverticulitis. No obstruction or inflammatory change. Small hiatal hernia. Lymphatic: No suspicious adenopathy. Small peripherally calcified node in the upper abdomen. Reproductive: Prostate is unremarkable. Other: Fat in both inguinal canals. Small fat containing  left lumbar hernia. No free fluid or free air. Musculoskeletal: There are no acute or suspicious osseous abnormalities. IMPRESSION: VASCULAR Aortic atherosclerosis without dissection or acute aortic abnormality. No aneurysm. Aortic Atherosclerosis (ICD10-I70.0). NON-VASCULAR 1. Nonobstructing left renal calculi on recent noncontrast exam are not as well seen given the presence of IV contrast. No obstructive uropathy. 2. No other change from noncontrast exam 6 hours ago. Electronically Signed   By: Keith Rake  M.D.   On: 12/02/2017 03:19     ASSESSMENT AND PLAN:  77 year old male patient with history of prostate hypertrophy, hypertension, renal cell cancer left kidney partial nephrectomy in the past currently under hospitalist service for lower back pain  -Acute on chronic low back pain With ambulatory dysfunction MRI lumbosacral spine to assess for any disc herniation Pain management and antispasmodics  -Hypocalcemia Ionized calcium levels  -DVT prophylaxis subcu Lovenox daily  -History of renal cell cancer with left kidney partial nephrectomy Avoid nephrotoxic medications Supportive care  -Physical therapy evaluation once MRI spine has been reviewed gait and balance training  All the records are reviewed and case discussed with Care Management/Social Worker. Management plans discussed with the patient, family and they are in agreement.  CODE STATUS: Full code  DVT Prophylaxis: SCDs  TOTAL TIME TAKING CARE OF THIS PATIENT: 45 minutes.   POSSIBLE D/C IN 1 DAYS, DEPENDING ON CLINICAL CONDITION.  Saundra Shelling M.D on 12/02/2017 at 1:37 PM  Between 7am to 6pm - Pager - [phone]  After 6pm go to [email or web address] - password [email or web address] [facility] [place] [facility]  Office  [phone]  CC: Primary care physician; Albina Billet, MD  Note: This dictation was prepared with Dragon dictation along with smaller phrase technology. Any transcriptional errors that result from this  process are unintentional.

## 2017-12-02 NOTE — H&P (Signed)
Mark Riggs at [place] NAME: Mark Riggs    MR#:  [id number]  DATE OF BIRTH:  05-Jan-1941  DATE OF ADMISSION:  12/01/2017  PRIMARY CARE PHYSICIAN: Albina Billet, MD   REQUESTING/REFERRING PHYSICIAN: Darel Hong, MD  CHIEF COMPLAINT:   Chief Complaint  Patient presents with  . Back Pain    HISTORY OF PRESENT ILLNESS:  Mark Riggs  is a 77 y.o. male with a known history of HTN, BPH, Hx L RCC (s/p partial nephrectomy) p/w L low back/L hip pain. Pt AAOx3, but exhibits some difficulty with memory/recall. He tells me he last saw his PCP ~8mo ago. He first said that his back/hip pain was acute, but then he tells me he has had it for "some time". I asked him about an ED visit in 04/2016 pertaining to L low back pain, which he seemed to recall when I brought it up. His chronic pain may have been exacerbated by a recent trip. He states ~3wks ago, he flew with his son to [place], and then his son drove him from [place] to [place]. He thinks the pain got worse after this trip. He believes the pain was worse when the car went over bumps.  At present, pt states that he has mild L low back/L hip pain at rest, significantly worse when he gets up and tries to ambulate. He endorses leg weakness and ambulatory dysfunction. He states when he strains to have a BM, he has severe shooting pains in his back/hip/buttock. He endorses one episode of urinary incontinence. He denies groin/saddle anesthesia, but states his groin goes numb when his back pain becomes severe.  Pt has a mild tremor at rest. He has a flat affect. I wonder if he has early Parkinson's disease. He first said he has had tremor for 2wks, then he said 16mo. His granddaughter states it has been going on for significantly longer. He also has some issues with his memory (was unable to remember his wife's phone number). I suspect some mild/early dementia.  PAST MEDICAL HISTORY:   Past Medical  History:  Diagnosis Date  . BPH (benign prostatic hyperplasia)   . Hyperlipemia   . Hypertension   . Pre-diabetes   . Renal cancer ([place]) 2007   left  . Renal mass     PAST SURGICAL HISTORY:   Past Surgical History:  Procedure Laterality Date  . APPENDECTOMY    . RENAL MASS EXCISION  2008  . TRANSURETHRAL RESECTION OF PROSTATE     patient unsure about this surgery long time ago by Dr. Ernst Spell    SOCIAL HISTORY:   Social History   Tobacco Use  . Smoking status: Former Research scientist (life sciences)  . Smokeless tobacco: Never Used  Substance Use Topics  . Alcohol use: No    Alcohol/week: 0.0 standard drinks    FAMILY HISTORY:  History reviewed. No pertinent family history.  DRUG ALLERGIES:   Allergies  Allergen Reactions  . Contrast Media [Iodinated Diagnostic Agents] Swelling  . Sulfa Antibiotics Swelling  . Tetracyclines & Related Other (See Comments)    unknown    REVIEW OF SYSTEMS:   Review of Systems  Constitutional: Negative for chills, diaphoresis, fever, malaise/fatigue and weight loss.  HENT: Negative for congestion, ear pain, hearing loss, nosebleeds, sinus pain, sore throat and tinnitus.   Eyes: Negative for blurred vision, double vision and photophobia.  Respiratory: Negative for cough, hemoptysis, sputum production, shortness of breath and wheezing.   Cardiovascular:  Negative for chest pain, palpitations, orthopnea, claudication, leg swelling and PND.  Gastrointestinal: Negative for abdominal pain, blood in stool, constipation, diarrhea, heartburn, melena, nausea and vomiting.  Genitourinary: Positive for flank pain. Negative for dysuria, frequency, hematuria and urgency.  Musculoskeletal: Positive for back pain and joint pain. Negative for falls, myalgias and neck pain.  Skin: Negative for itching and rash.  Neurological: Positive for tremors and weakness. Negative for dizziness, tingling, sensory change, speech change, focal weakness, seizures, loss of consciousness and  headaches.  Psychiatric/Behavioral: Positive for memory loss. The patient is nervous/anxious. The patient does not have insomnia.    MEDICATIONS AT HOME:   Prior to Admission medications   Medication Sig Start Date End Date Taking? Authorizing Provider  aspirin 81 MG tablet Take 81 mg by mouth daily.   Yes [provider]  ciprofloxacin (CIPRO) 500 MG tablet Take 1 tablet (500 mg total) by mouth 2 (two) times daily for 14 days. 11/25/17 12/09/17 Yes Melynda Ripple, MD  glimepiride (AMARYL) 2 MG tablet Take 2 mg by mouth daily with breakfast.   Yes [provider]  lisinopril (PRINIVIL,ZESTRIL) 5 MG tablet Take 5 mg by mouth daily.   Yes [provider]  simvastatin (ZOCOR) 40 MG tablet Take 40 mg by mouth daily.   Yes [provider]  tamsulosin (FLOMAX) 0.4 MG CAPS capsule Take 1 capsule (0.4 mg total) by mouth at bedtime. 11/25/17 12/25/17 Yes Melynda Ripple, MD  ALPRAZolam Duanne Moron) 1 MG tablet Take 1 mg by mouth 3 (three) times daily as needed for anxiety.    [provider]      VITAL SIGNS:  Blood pressure (!) 153/89, pulse 80, resp. rate 16, SpO2 96 %.  PHYSICAL EXAMINATION:  Physical Exam  Constitutional: He is oriented to person, place, and time. He appears well-developed and well-nourished. He is active and cooperative.  Non-toxic appearance. He does not have a sickly appearance. He does not appear ill. No distress. He is not intubated.  HENT:  Head: Normocephalic and atraumatic.  Mouth/Throat: Oropharynx is clear and moist. No oropharyngeal exudate.  Eyes: Conjunctivae, EOM and lids are normal. No scleral icterus.  Neck: Neck supple. No JVD present. No thyromegaly present.  Cardiovascular: Normal rate, regular rhythm, S1 normal, S2 normal and normal heart sounds.  No extrasystoles are present. Exam reveals no gallop, no S3, no S4, no distant heart sounds and no friction rub.  No murmur heard. Pulmonary/Chest: Effort normal and  breath sounds normal. No accessory muscle usage or stridor. No apnea, no tachypnea and no bradypnea. He is not intubated. No respiratory distress. He has no decreased breath sounds. He has no wheezes. He has no rhonchi. He has no rales.  Abdominal: Soft. Bowel sounds are normal. He exhibits no distension. There is no tenderness. There is no rigidity, no rebound, no guarding and no CVA tenderness.  Musculoskeletal: Normal range of motion. He exhibits tenderness. He exhibits no edema.  Lymphadenopathy:    He has no cervical adenopathy.  Neurological: He is alert and oriented to person, place, and time. He is not disoriented.  Skin: Skin is warm, dry and intact. No rash noted. He is not diaphoretic. No erythema.  Psychiatric: Judgment and thought content normal. His mood appears anxious. His speech is delayed. His speech is not rapid and/or pressured, not tangential and not slurred. He is slowed. He is not agitated, not aggressive, not hyperactive, not withdrawn and not combative. Cognition and memory are impaired. He is communicative. He exhibits  abnormal recent memory and abnormal remote memory.  Flat affect. Anxious at times.   Does not exhibit true CVA tenderness. LABORATORY PANEL:   CBC Recent Labs  Lab 12/01/17 2116  WBC 6.9  HGB 14.7  HCT 41.6  PLT 141*   ------------------------------------------------------------------------------------------------------------------  Chemistries  Recent Labs  Lab 12/01/17 2116  NA 140  K 4.2  CL 106  CO2 26  GLUCOSE 159*  BUN 21  CREATININE 1.11  CALCIUM 8.7*  AST 19  ALT 22  ALKPHOS 69  BILITOT 0.7   ------------------------------------------------------------------------------------------------------------------  Cardiac Enzymes No results for input(s): TROPONINI in the last 168 hours. ------------------------------------------------------------------------------------------------------------------  RADIOLOGY:  Ct Renal Stone  Study  Result Date: 12/01/2017 CLINICAL DATA:  Left-sided back pain. Left CVA tenderness on exam. Recently diagnosed prostatitis. EXAM: CT ABDOMEN AND PELVIS WITHOUT CONTRAST TECHNIQUE: Multidetector CT imaging of the abdomen and pelvis was performed following the standard protocol without IV contrast. COMPARISON:  02/06/2007 FINDINGS: Lower chest: No acute findings. Hepatobiliary: No mass visualized on this unenhanced exam. Tiny gallstones versus gallbladder sludge. No evidence of cholecystitis or biliary ductal dilatation. Pancreas: No mass or inflammatory process visualized on this unenhanced exam. Spleen:  Within normal limits in size. Adrenals/Urinary tract: A few tiny 1-2 mm calculi are seen in the upper pole of the left kidney. No evidence of ureteral calculi or hydronephrosis. Several fluid attenuation renal cysts again noted bilaterally. Unremarkable unopacified urinary bladder. Stomach/Bowel: Small hiatal hernia. No evidence of obstruction, inflammatory process, or abnormal fluid collections. Mild diverticulosis is seen mainly involving the sigmoid colon, however there is no evidence of diverticulitis. Vascular/Lymphatic: No pathologically enlarged lymph nodes identified. No evidence of abdominal aortic aneurysm. Aortic atherosclerosis. Reproductive:  No mass or other significant abnormality. Other:  None. Musculoskeletal:  No suspicious bone lesions identified. IMPRESSION: Tiny left renal calculi. No evidence of ureteral calculi, hydronephrosis, or other acute findings. Tiny gallstones versus gallbladder sludge. No radiographic evidence of cholecystitis or biliary dilatation. Small hiatal hernia. Mild sigmoid diverticulosis, without radiographic evidence of diverticulitis. Electronically Signed   By: Earle Gell M.D.   On: 12/01/2017 21:58   Ct Angio Abd/pel W And/or Wo Contrast  Result Date: 12/02/2017 CLINICAL DATA:  Flank pain.  Evaluate for dissection. EXAM: CTA ABDOMEN AND PELVIS wITHOUT AND  WITH CONTRAST TECHNIQUE: Multidetector CT imaging of the abdomen and pelvis was performed using the standard protocol during bolus administration of intravenous contrast. Multiplanar reconstructed images and MIPs were obtained and reviewed to evaluate the vascular anatomy. CONTRAST:  167mL ISOVUE-370 IOPAMIDOL (ISOVUE-370) INJECTION 76% COMPARISON:  Noncontrast CT 6 hours prior. FINDINGS: VASCULAR Aorta: Normal caliber aorta without aneurysm, dissection, vasculitis or significant stenosis. Mild to moderate atherosclerotic plaque. Celiac: Patent without evidence of aneurysm, dissection, vasculitis or significant stenosis. SMA: Patent without evidence of aneurysm, dissection, vasculitis or significant stenosis. Renals: Both renal arteries are patent without evidence of aneurysm, dissection, vasculitis, fibromuscular dysplasia or significant stenosis. Mild atherosclerosis at the origin, left greater than right. IMA: Patent without evidence of aneurysm, dissection, vasculitis or significant stenosis. Inflow: Patent without evidence of aneurysm, dissection, vasculitis or significant stenosis. Proximal Outflow: Bilateral common femoral and visualized portions of the superficial and profunda femoral arteries are patent without evidence of aneurysm, dissection, vasculitis or significant stenosis. Veins: The IVC, portal and mesenteric veins are patent. Review of the MIP images confirms the above findings. NON-VASCULAR Lower chest: No acute findings. Hepatobiliary: No focal hepatic abnormality. Minimal layering sludge versus small stones in the gallbladder, unchanged from prior. No pericholecystic  inflammation. Pancreas: No ductal dilatation or inflammation. Spleen: Normal in size without focal abnormality. Adrenals/Urinary Tract: Normal adrenal glands. No hydronephrosis. Mild symmetric bilateral perinephric edema. Tiny nonobstructing stone in the left kidney on prior noncontrast exam are not as well seen in the presence of  IV contrast. Bilateral renal cysts. Scarring in the posterior left kidney. Urinary bladder is partially distended. No bladder wall thickening. Stomach/Bowel: Mild colonic diverticulosis without diverticulitis. No obstruction or inflammatory change. Small hiatal hernia. Lymphatic: No suspicious adenopathy. Small peripherally calcified node in the upper abdomen. Reproductive: Prostate is unremarkable. Other: Fat in both inguinal canals. Small fat containing left lumbar hernia. No free fluid or free air. Musculoskeletal: There are no acute or suspicious osseous abnormalities. IMPRESSION: VASCULAR Aortic atherosclerosis without dissection or acute aortic abnormality. No aneurysm. Aortic Atherosclerosis (ICD10-I70.0). NON-VASCULAR 1. Nonobstructing left renal calculi on recent noncontrast exam are not as well seen given the presence of IV contrast. No obstructive uropathy. 2. No other change from noncontrast exam 6 hours ago. Electronically Signed   By: Keith Rake M.D.   On: 12/02/2017 03:19   IMPRESSION AND PLAN:   A/P: 59M p/w acute on chronic L low back/L hip pain, ambulatory dysfxn; suspect lumbar disc herniation, sciatica. Hyperglycemia, hypocalcemia. Tremor, flat affect. Difficulty with memory/recall. -Acute on chronic L low back/L hip pain, ambulatory dysfxn; suspect lumbar disc herniation, sciatica: Symptomatic mgmt, pain ctrl, antispasmodics. MRI L-spine + L hip. Pre-MRI sedation ordered. P/T eval. Fall precautions. -Hyperglycemia: Documented pre-diabetic. HbA1c. SSI. -Hypocalcemia: Ionized calcium. -Tremor, flat affect: Suspect early Parkinson's disease. Outpt w/up. -Difficulty w/ memory/recall: Suspect early/mild dementia. Outpt w/up. -c/w home meds/formulary subs. -FEN/GI: Cardiac diabetic diet. -DVT PPx: Lovenox. -Code status: Full code. -Disposition: Observation, < 2 midnights.   All the records are reviewed and case discussed with ED provider. Management plans discussed with the  patient, family and they are in agreement.  CODE STATUS: Full code.  TOTAL TIME TAKING CARE OF THIS PATIENT: 75 minutes.    Arta Silence M.D on 12/02/2017 at 4:19 AM  Between 7am to 6pm - Pager - [phone]  After 6pm go to [email or web address] - Proofreader  [facility]  Office  [phone]  CC: Primary care physician; Albina Billet, MD   Note: This dictation was prepared with Dragon dictation along with smaller phrase technology. Any transcriptional errors that result from this process are unintentional.

## 2017-12-02 NOTE — ED Notes (Signed)
Patient transported to CT 

## 2017-12-02 NOTE — Progress Notes (Signed)
Advanced care plan.  Purpose of the Encounter: CODE STATUS  Parties in Attendance: Patient and family  Patient's Decision Capacity: Good  Subjective/Patient's story: Presented to emergency room for back pain   Objective/Medical story Patient has chronic back pain but has been exacerbated recently Needs evaluation with MRI of the lumbosacral spine to assess for any spinal stenosis disc herniation   Goals of care determination:  Advance care directives and goals of care discussed Patient wants everything done which includes CPR, intubation ventilator if the need arises   CODE STATUS: Full code   Time spent discussing advanced care planning: 16 minutes

## 2017-12-02 NOTE — Care Management Obs Status (Signed)
[facility] NOTIFICATION   Patient Details  Name: Mark Riggs MRN: [id number] Date of Birth: [birth year]   Medicare Observation Status Notification Given:  Yes; explained to Mr. Braswell, RN 12/02/2017, 9:24 AM

## 2017-12-03 DIAGNOSIS — R29898 Other symptoms and signs involving the musculoskeletal system: Secondary | ICD-10-CM | POA: Diagnosis not present

## 2017-12-03 DIAGNOSIS — M25552 Pain in left hip: Secondary | ICD-10-CM | POA: Diagnosis not present

## 2017-12-03 DIAGNOSIS — R262 Difficulty in walking, not elsewhere classified: Secondary | ICD-10-CM | POA: Diagnosis not present

## 2017-12-03 DIAGNOSIS — M545 Low back pain: Secondary | ICD-10-CM | POA: Diagnosis not present

## 2017-12-03 LAB — CALCIUM, IONIZED: CALCIUM, IONIZED, SERUM: 4.7 mg/dL (ref 4.5–5.6)

## 2017-12-03 LAB — GLUCOSE, CAPILLARY
GLUCOSE-CAPILLARY: 119 mg/dL — AB (ref 70–99)
GLUCOSE-CAPILLARY: 122 mg/dL — AB (ref 70–99)

## 2017-12-03 MED ORDER — METHOCARBAMOL 500 MG PO TABS: 500.0000 mg | ORAL_TABLET | Freq: Four times a day (QID) | ORAL | 0 refills | Status: DC | PRN

## 2017-12-03 NOTE — Progress Notes (Signed)
PT Cancellation Note  Patient Details Name: [name] MRN: [id number] DOB: 1940/12/31   Cancelled Treatment:    Reason Eval/Treat Not Completed: Medical issues which prohibited therapy(Will hold PT evaluation pending neurosugery consultation to determine plan of care and any relevent precautions.  Will continue to follow and initiate as appropriate.)   Deandre Brannan H. Owens Shark, PT, DPT, NCS 12/03/17, 9:44 AM [phone]

## 2017-12-03 NOTE — Evaluation (Signed)
Physical Therapy Evaluation Patient Details Name: Mark Riggs MRN: [id number] DOB: February 05, 1940 Today's Date: 12/03/2017   History of Present Illness  Pt is a 77 y/o M who presented with back pain that has worsened acutely after road trip.  Reports severe shooting pains in back/L hip/L buttock, one episode of urinary incontinent. Pt denies saddle anesthesia but reports groin goes numb when back pain is severe.  MRI lumbar spine: L3-4 disc protrusion, severe canal and L lateral recess narrowing with mod L L3 foraminal stenosis at this level.  Disc bulging L2-3 with mild cancal and Bil subarticular stenosis.  Chronic degenerative spondylolysis at L5-S1 with mild to mod L greater than right L5 foraminal stenosis.  Neurosurgery consulted who suspects pain is mostly musculoskeletal with dx of lumbar stenosis.  Plan is for follow up as needed in outpatient clinic.  Suspect possible Parkinson's Disease with mild tremor at rest (per chart review).  Pt's PMH includes mild memory deficits with suspected early dementia, L renal cancer.    Clinical Impression  Pt admitted with above diagnosis. Pt currently with functional limitations due to the deficits listed below (see PT Problem List).  Pt was independent with transfers and mod I for ambulation with only mild deficits noted with challenges to balance when ambulating.  Educated pt on how to protect his back with daily activities and encouraged pt to follow up in [place] for further education, to address any ongoing pain, and with goal to prevent flare up in the future. Pt will benefit from skilled PT to increase their independence and safety with mobility to allow discharge to the venue listed below.      Follow Up Recommendations Outpatient PT(to address back pain/prevent recurrent flare)    Equipment Recommendations  None recommended by PT    Recommendations for Other Services       Precautions / Restrictions Precautions Precautions: Other  (comment) Precaution Comments: No specific back precuations but instructed pt in back precautions given imaging results.   Restrictions Weight Bearing Restrictions: No      Mobility  Bed Mobility               General bed mobility comments: Pt sitting in chair at start and end of session  Transfers Overall transfer level: Independent Equipment used: None             General transfer comment: No signs of instability.  Pt independent.  Ambulation/Gait Ambulation/Gait assistance: Modified independent (Device/Increase time) Gait Distance (Feet): 80 Feet Assistive device: None Gait Pattern/deviations: Step-through pattern     General Gait Details: Pt steady with dec arm swing and trunk rotation Bil.  With head turns pt demonstrates mild instability but no LOB  Stairs            Wheelchair Mobility    Modified Rankin (Stroke Patients Only)       Balance Overall balance assessment: Needs assistance                           High level balance activites: Head turns;Other (comment);Turns(stepping over object) High Level Balance Comments: Mild instability with vertical and horizontal head turns.               Pertinent Vitals/Pain Pain Assessment: No/denies pain(also denies numbnes/tingling)    Home Living Family/patient expects to be discharged to:: Private residence Living Arrangements: Spouse/significant other Available Help at Discharge: Family Type of Home: House Home Access: Stairs to enter Entrance  Stairs-Rails: None Entrance Stairs-Number of Steps: 1 Home Layout: One level Home Equipment: None      Prior Function Level of Independence: Independent         Comments: Ambulating without D.  No falls in the past 6 months.  Ind with ADLs.      Hand Dominance        Extremity/Trunk Assessment   Upper Extremity Assessment Upper Extremity Assessment: RUE deficits/detail;LUE deficits/detail RUE Deficits / Details:  Strength grossly 4+/5 RUE Sensation: WNL RUE Coordination: WNL LUE Deficits / Details: Strength grossly 4+/5 LUE Sensation: WNL LUE Coordination: WNL    Lower Extremity Assessment Lower Extremity Assessment: RLE deficits/detail;LLE deficits/detail RLE Deficits / Details: Strength grossly 4+/5 RLE Sensation: WNL RLE Coordination: WNL LLE Deficits / Details: Strength grossly 4+/5 LLE Sensation: WNL LLE Coordination: WNL    Cervical / Trunk Assessment Cervical / Trunk Assessment: Other exceptions Cervical / Trunk Exceptions: admitted for back pain  Communication   Communication: No difficulties  Cognition Arousal/Alertness: Awake/alert Behavior During Therapy: Flat affect Overall Cognitive Status: History of cognitive impairments - at baseline                                 General Comments: mild memory deficits at baseline      General Comments General comments (skin integrity, edema, etc.): Pt TTP L lower back which pt reports is chronic due to h/o renal cancer and pt says it is not related to his acute back pain    Exercises     Assessment/Plan    PT Assessment All further PT needs can be met in the next venue of care  PT Problem List Pain       PT Treatment Interventions      PT Goals (Current goals can be found in the Care Plan section)  Acute Rehab PT Goals Patient Stated Goal: to go home PT Goal Formulation: All assessment and education complete, DC therapy    Frequency     Barriers to discharge        Co-evaluation               AM-PAC PT "6 Clicks" Mobility  Outcome Measure Help needed turning from your back to your side while in a flat bed without using bedrails?: None Help needed moving from lying on your back to sitting on the side of a flat bed without using bedrails?: None Help needed moving to and from a bed to a chair (including a wheelchair)?: None Help needed standing up from a chair using your arms (e.g., wheelchair  or bedside chair)?: None Help needed to walk in hospital room?: None Help needed climbing 3-5 steps with a railing? : None 6 Click Score: 24    End of Session Equipment Utilized During Treatment: Gait belt Activity Tolerance: Patient tolerated treatment well Patient left: in chair;with family/visitor present Nurse Communication: Mobility status PT Visit Diagnosis: Pain Pain - Right/Left: (lower) Pain - part of body: (back)    Time: 1450-1514 PT Time Calculation (min) (ACUTE ONLY): 24 min   Charges:   PT Evaluation $PT Eval Low Complexity: 1 Low          Session was performed by student PT, Belva Crome, and directed, overseen, and documented by this PT.  Collie Siad PT, DPT 12/03/2017, 4:01 PM

## 2017-12-03 NOTE — Plan of Care (Signed)

## 2017-12-03 NOTE — Progress Notes (Signed)
Pt for discharge home a/o. No resp distress. No c/o  Pain at present/ instructions to pt and wife.  presc given and discussed. Home meds discussed, diet , activity and f/u discussed also / verbalizes understanding of all, sl d/cd earlier.  Wife  To transport pt.  Out at this time  Post p.t. Seeing.

## 2017-12-03 NOTE — Discharge Summary (Signed)
[facility] [place] NAME: Mark Riggs    MR#:  [id number]  DATE OF BIRTH:  [birth year]  DATE OF ADMISSION:  12/01/2017 ADMITTING PHYSICIAN: Arta Silence, MD  DATE OF DISCHARGE: 12/03/2017  PRIMARY CARE PHYSICIAN: Albina Billet, MD   ADMISSION DIAGNOSIS:  Left flank pain [R10.9] Low back pain Hypertension Hyperlipidemia History of left renal cancer DISCHARGE DIAGNOSIS:  Low back pain Lumbar spinal stenosis Hypertension Hyperlipidemia History of left renal cancer  SECONDARY DIAGNOSIS:   Past Medical History:  Diagnosis Date  . BPH (benign prostatic hyperplasia)   . Hyperlipemia   . Hypertension   . Pre-diabetes   . Renal cancer ([place]) 2007   left  . Renal mass      ADMITTING HISTORY Mr. Dehoyos is here with acute on chronic back pain that started recently after increased lifting while travelling. They visited urgent care where he was treated for prostate concerns. He felt it worsened and he presented to the ED. He denies any leg pain or numbness. He feels his legs are strong. He currently feels his back pain is much better with the medications. He has been ambulating in the room.  HOSPITAL COURSE:  Patient was admitted to medical floor.  He was worked up with CT abdomen and pelvis which showed known nephrolithiasis.  Patient had persistent low back pain and he was worked up with MRI lumbar sacral and thoracic spine.  Spinal canal stenosis was noted with disc bulge but no evidence of any radiculopathy.  Neurosurgery consultation was done recommended pain management and follow-up as outpatient.  No acute intervention recommended.  Patient's back pain improved with analgesics in the hospital and ambulated well.  Patient will be discharged home and follow-up with neurology and neurosurgery in the clinic if needed.  Advised to not to lift heavy weights and no abrupt bending.  CONSULTS OBTAINED:  Treatment Team:  Arta Silence, MD  DRUG ALLERGIES:   Allergies  Allergen Reactions  . Contrast Media [Iodinated Diagnostic Agents] Swelling  . Sulfa Antibiotics Swelling  . Tetracyclines & Related Other (See Comments)    unknown    DISCHARGE MEDICATIONS:   Allergies as of 12/03/2017      Reactions   Contrast Media [iodinated Diagnostic Agents] Swelling   Sulfa Antibiotics Swelling   Tetracyclines & Related Other (See Comments)   unknown      Medication List    TAKE these medications   ALPRAZolam 1 MG tablet Commonly known as:  XANAX Take 1 mg by mouth 3 (three) times daily as needed for anxiety.   aspirin 81 MG tablet Take 81 mg by mouth daily.   ciprofloxacin 500 MG tablet Commonly known as:  CIPRO Take 1 tablet (500 mg total) by mouth 2 (two) times daily for 14 days.   glimepiride 2 MG tablet Commonly known as:  AMARYL Take 2 mg by mouth daily with breakfast.   lisinopril 5 MG tablet Commonly known as:  PRINIVIL,ZESTRIL Take 5 mg by mouth daily.   methocarbamol 500 MG tablet Commonly known as:  ROBAXIN Take 1 tablet (500 mg total) by mouth every 6 (six) hours as needed for muscle spasms.   simvastatin 40 MG tablet Commonly known as:  ZOCOR Take 40 mg by mouth daily.   tamsulosin 0.4 MG Caps capsule Commonly known as:  FLOMAX Take 1 capsule (0.4 mg total) by mouth at bedtime.       Today  Patient seen and evaluated today  Back pain better Tolerating diet well Ambulating well VITAL SIGNS:  Blood pressure (!) 155/83, pulse 76, temperature (!) 97.5 F (36.4 C), temperature source Oral, resp. rate 20, height 5\' 9"  (1.753 m), weight 84.2 kg, SpO2 97 %.  I/O:    Intake/Output Summary (Last 24 hours) at 12/03/2017 1432 Last data filed at 12/02/2017 2253 Gross per 24 hour  Intake 420 ml  Output 200 ml  Net 220 ml    PHYSICAL EXAMINATION:  Physical Exam  GENERAL:  77 y.o.-year-old patient lying in the bed with no acute distress.  LUNGS: Normal breath sounds  bilaterally, no wheezing, rales,rhonchi or crepitation. No use of accessory muscles of respiration.  CARDIOVASCULAR: S1, S2 normal. No murmurs, rubs, or gallops.  ABDOMEN: Soft, non-tender, non-distended. Bowel sounds present. No organomegaly or mass.  NEUROLOGIC: Moves all 4 extremities. PSYCHIATRIC: The patient is alert and oriented x 3.  SKIN: No obvious rash, lesion, or ulcer.   DATA REVIEW:   CBC Recent Labs  Lab 12/01/17 2116  WBC 6.9  HGB 14.7  HCT 41.6  PLT 141*    Chemistries  Recent Labs  Lab 12/01/17 2116  NA 140  K 4.2  CL 106  CO2 26  GLUCOSE 159*  BUN 21  CREATININE 1.11  CALCIUM 8.7*  MG 2.1  AST 19  ALT 22  ALKPHOS 69  BILITOT 0.7    Cardiac Enzymes No results for input(s): TROPONINI in the last 168 hours.  Microbiology Results  No results found for this or any previous visit.  RADIOLOGY:  Mr Lumbar Spine Wo Contrast  Result Date: 12/02/2017 CLINICAL DATA:  Initial evaluation for acute lower back pain with left lower extremity radicular symptoms. EXAM: MRI LUMBAR SPINE WITHOUT CONTRAST TECHNIQUE: Multiplanar, multisequence MR imaging of the lumbar spine was performed. No intravenous contrast was administered. COMPARISON:  None available. FINDINGS: Segmentation: Normal segmentation. Lowest well-formed disc labeled the L5-S1 level. Alignment: Vertebral bodies normally aligned with preservation of the normal lumbar lordosis. No listhesis or malalignment. Vertebrae: Vertebral body heights maintained without evidence for acute or chronic fracture. Scattered chronic endplate Schmorl's nodes seen throughout the lumbar spine. Underlying bone marrow signal intensity within normal limits. No worrisome osseous lesions. Reactive endplate changes about the left posterior aspect of the L3-4 interspace. No other abnormal marrow edema. Conus medullaris and cauda equina: Conus extends to the L1 level. Conus and cauda equina appear normal. Paraspinal and other soft  tissues: Paraspinous soft tissues within normal limits. Multiple T2 hyperintense renal cysts noted bilaterally. Visualized visceral structures otherwise unremarkable. Disc levels: L1-2: Mild diffuse disc bulge with disc desiccation and intervertebral disc space narrowing. No canal or foraminal stenosis. L2-3: Diffuse disc bulge with disc desiccation and intervertebral disc space narrowing. Mild facet and ligament flavum hypertrophy. Resultant mild canal with bilateral lateral recess narrowing. No significant foraminal encroachment. L3-4: Diffuse disc bulge with disc desiccation and intervertebral disc space narrowing. Superimposed broad-based left subarticular/foraminal disc protrusion (series 8, image 24). Associated reactive endplate changes with endplate osteophytic spurring and mild marrow edema. Moderate facet and ligament flavum hypertrophy. Resultant severe canal with left greater than right lateral recess stenosis. Protruding disc could potentially affect either of the exiting left L3 or descending L4 nerve roots. Moderate left with mild right L3 foraminal stenosis. L4-5: Mild diffuse disc bulge with disc desiccation. Mild facet and ligament flavum hypertrophy. No significant spinal stenosis. Mild left L4 foraminal narrowing. L5-S1: Chronic intervertebral disc space narrowing with diffuse disc bulge and disc desiccation. Reactive endplate  changes with marginal endplate osteophytic spurring. Mild facet hypertrophy with osteophytic overgrowth at the left L5-S1 facet. No significant spinal stenosis. Mild to moderate bilateral L5 foraminal narrowing, left greater than right. IMPRESSION: 1. Broad based left subarticular/foraminal disc protrusion at L3-4, potentially affecting either the left L3 or descending left L4 nerve roots. Resultant severe canal and left lateral recess narrowing with moderate left L3 foraminal stenosis at this level. 2. Disc bulging with facet hypertrophy at L2-3 with resultant mild canal  and bilateral subarticular stenosis. 3. Chronic degenerative spondylolysis at L5-S1 with resultant mild to moderate left greater than right L5 foraminal stenosis. Electronically Signed   By: Jeannine Boga M.D.   On: 12/02/2017 15:01   Mr Hip Left Wo Contrast  Result Date: 12/02/2017 CLINICAL DATA:  Chronic left hip pain.  No known injury. EXAM: MR OF THE LEFT HIP WITHOUT CONTRAST TECHNIQUE: Multiplanar, multisequence MR imaging was performed. No intravenous contrast was administered. COMPARISON:  CT abdomen and pelvis 12/02/2017. MRI lumbar spine 12/02/2017. FINDINGS: Bones: No fracture, stress change or worrisome lesion is identified. No avascular necrosis of the femoral heads. No subchondral cyst formation or edema about either hip. Punctate focus of decreased T1 and T2 signal in the intertrochanteric left femur is likely a tiny bone island. Schmorl's node in the posterior aspect of the L4 superior endplate is noted as seen on lumbar spine MRI. Articular cartilage and labrum Articular cartilage:  Appears preserved. Labrum:  Intact. Joint or bursal effusion Joint effusion:  None. Bursae: Negative. Muscles and tendons Muscles and tendons:  Intact. Other findings Miscellaneous:   Imaged intrapelvic contents are unremarkable. IMPRESSION: Negative MRI of the left hip Electronically Signed   By: Inge Rise M.D.   On: 12/02/2017 16:11   Ct Renal Stone Study  Result Date: 12/01/2017 CLINICAL DATA:  Left-sided back pain. Left CVA tenderness on exam. Recently diagnosed prostatitis. EXAM: CT ABDOMEN AND PELVIS WITHOUT CONTRAST TECHNIQUE: Multidetector CT imaging of the abdomen and pelvis was performed following the standard protocol without IV contrast. COMPARISON:  02/06/2007 FINDINGS: Lower chest: No acute findings. Hepatobiliary: No mass visualized on this unenhanced exam. Tiny gallstones versus gallbladder sludge. No evidence of cholecystitis or biliary ductal dilatation. Pancreas: No mass or  inflammatory process visualized on this unenhanced exam. Spleen:  Within normal limits in size. Adrenals/Urinary tract: A few tiny 1-2 mm calculi are seen in the upper pole of the left kidney. No evidence of ureteral calculi or hydronephrosis. Several fluid attenuation renal cysts again noted bilaterally. Unremarkable unopacified urinary bladder. Stomach/Bowel: Small hiatal hernia. No evidence of obstruction, inflammatory process, or abnormal fluid collections. Mild diverticulosis is seen mainly involving the sigmoid colon, however there is no evidence of diverticulitis. Vascular/Lymphatic: No pathologically enlarged lymph nodes identified. No evidence of abdominal aortic aneurysm. Aortic atherosclerosis. Reproductive:  No mass or other significant abnormality. Other:  None. Musculoskeletal:  No suspicious bone lesions identified. IMPRESSION: Tiny left renal calculi. No evidence of ureteral calculi, hydronephrosis, or other acute findings. Tiny gallstones versus gallbladder sludge. No radiographic evidence of cholecystitis or biliary dilatation. Small hiatal hernia. Mild sigmoid diverticulosis, without radiographic evidence of diverticulitis. Electronically Signed   By: Earle Gell M.D.   On: 12/01/2017 21:58   Ct Angio Abd/pel W And/or Wo Contrast  Result Date: 12/02/2017 CLINICAL DATA:  Flank pain.  Evaluate for dissection. EXAM: CTA ABDOMEN AND PELVIS wITHOUT AND WITH CONTRAST TECHNIQUE: Multidetector CT imaging of the abdomen and pelvis was performed using the standard protocol during bolus administration of  intravenous contrast. Multiplanar reconstructed images and MIPs were obtained and reviewed to evaluate the vascular anatomy. CONTRAST:  151mL ISOVUE-370 IOPAMIDOL (ISOVUE-370) INJECTION 76% COMPARISON:  Noncontrast CT 6 hours prior. FINDINGS: VASCULAR Aorta: Normal caliber aorta without aneurysm, dissection, vasculitis or significant stenosis. Mild to moderate atherosclerotic plaque. Celiac: Patent  without evidence of aneurysm, dissection, vasculitis or significant stenosis. SMA: Patent without evidence of aneurysm, dissection, vasculitis or significant stenosis. Renals: Both renal arteries are patent without evidence of aneurysm, dissection, vasculitis, fibromuscular dysplasia or significant stenosis. Mild atherosclerosis at the origin, left greater than right. IMA: Patent without evidence of aneurysm, dissection, vasculitis or significant stenosis. Inflow: Patent without evidence of aneurysm, dissection, vasculitis or significant stenosis. Proximal Outflow: Bilateral common femoral and visualized portions of the superficial and profunda femoral arteries are patent without evidence of aneurysm, dissection, vasculitis or significant stenosis. Veins: The IVC, portal and mesenteric veins are patent. Review of the MIP images confirms the above findings. NON-VASCULAR Lower chest: No acute findings. Hepatobiliary: No focal hepatic abnormality. Minimal layering sludge versus small stones in the gallbladder, unchanged from prior. No pericholecystic inflammation. Pancreas: No ductal dilatation or inflammation. Spleen: Normal in size without focal abnormality. Adrenals/Urinary Tract: Normal adrenal glands. No hydronephrosis. Mild symmetric bilateral perinephric edema. Tiny nonobstructing stone in the left kidney on prior noncontrast exam are not as well seen in the presence of IV contrast. Bilateral renal cysts. Scarring in the posterior left kidney. Urinary bladder is partially distended. No bladder wall thickening. Stomach/Bowel: Mild colonic diverticulosis without diverticulitis. No obstruction or inflammatory change. Small hiatal hernia. Lymphatic: No suspicious adenopathy. Small peripherally calcified node in the upper abdomen. Reproductive: Prostate is unremarkable. Other: Fat in both inguinal canals. Small fat containing left lumbar hernia. No free fluid or free air. Musculoskeletal: There are no acute or  suspicious osseous abnormalities. IMPRESSION: VASCULAR Aortic atherosclerosis without dissection or acute aortic abnormality. No aneurysm. Aortic Atherosclerosis (ICD10-I70.0). NON-VASCULAR 1. Nonobstructing left renal calculi on recent noncontrast exam are not as well seen given the presence of IV contrast. No obstructive uropathy. 2. No other change from noncontrast exam 6 hours ago. Electronically Signed   By: Keith Rake M.D.   On: 12/02/2017 03:19    Follow up with PCP in 1 week.  Management plans discussed with the patient, family and they are in agreement.  CODE STATUS: Full code    Code Status Orders  (From admission, onward)         Start     Ordered   12/02/17 0453  Full code  Continuous     12/02/17 0453        Code Status History    This patient has a current code status but no historical code status.    Advance Directive Documentation     Most Recent Value  Type of Advance Directive  Healthcare Power of Attorney, Living will  Pre-existing out of facility DNR order (yellow form or pink MOST form)  -  "MOST" Form in Place?  -      TOTAL TIME TAKING CARE OF THIS PATIENT ON DAY OF DISCHARGE: more than 34 minutes.   Saundra Shelling M.D on 12/03/2017 at 2:32 PM  Between 7am to 6pm - Pager - [phone]  After 6pm go to [email or web address] - password [email or web address] [facility]  Office  [phone]  CC: Primary care physician; Albina Billet, MD  Note: This dictation was prepared with Dragon dictation along with smaller phrase technology. Any transcriptional errors that result  from this process are unintentional.

## 2017-12-03 NOTE — Consult Note (Signed)
Neurosurgery-New Consultation Evaluation 12/03/2017 Mark Riggs [id number]  Identifying Statement: Mark Riggs is a 77 y.o. male from [place] [address] with back pain  Physician Requesting Consultation: Dr. Reatha Harps Pyreddy   History of Present Illness: Mark Riggs is here with acute on chronic back pain that started recently after increased lifting while travelling. They visited urgent care where he was treated for prostate concerns. He felt it worsened and he presented to the ED. He denies any leg pain or numbness. He feels his legs are strong. He currently feels his back pain is much better with the medications. He has been ambulating in the room.    Past Medical History:  Past Medical History:  Diagnosis Date  . BPH (benign prostatic hyperplasia)   . Hyperlipemia   . Hypertension   . Pre-diabetes   . Renal cancer ([place]) 2007   left  . Renal mass     Social History: Social History   Socioeconomic History  . Marital status: Married    Spouse name: Not on file  . Number of children: Not on file  . Years of education: Not on file  . Highest education level: Not on file  Occupational History  . Not on file  Social Needs  . Financial resource strain: Patient refused  . Food insecurity:    Worry: Patient refused    Inability: Patient refused  . Transportation needs:    Medical: Patient refused    Non-medical: Patient refused  Tobacco Use  . Smoking status: Former Research scientist (life sciences)  . Smokeless tobacco: Never Used  Substance and Sexual Activity  . Alcohol use: No    Alcohol/week: 0.0 standard drinks  . Drug use: No  . Sexual activity: Not Currently  Lifestyle  . Physical activity:    Days per week: Patient refused    Minutes per session: Patient refused  . Stress: Patient refused  Relationships  . Social connections:    Talks on phone: Patient refused    Gets together: Patient refused    Attends religious service: Patient refused    Active member of club or  organization: Patient refused    Attends meetings of clubs or organizations: Patient refused    Relationship status: Patient refused  . Intimate partner violence:    Fear of current or ex partner: Patient refused    Emotionally abused: Patient refused    Physically abused: Patient refused    Forced sexual activity: Patient refused  Other Topics Concern  . Not on file  Social History Narrative  . Not on file    Family History: History reviewed. No pertinent family history.  Review of Systems:  Review of Systems - General ROS: Negative Psychological ROS: Negative Ophthalmic ROS: Negative ENT ROS: Negative Hematological and Lymphatic ROS: Negative  Endocrine ROS: Negative Respiratory ROS: Negative Cardiovascular ROS: Negative Gastrointestinal ROS: Negative Genito-Urinary ROS: Negative Musculoskeletal ROS: Positive for back pain Neurological ROS: Negative for numbness or weakness Dermatological ROS: Negative  Physical Exam: BP (!) 155/83   Pulse 76   Temp (!) 97.5 F (36.4 C) (Oral)   Resp 20   Ht 5\' 9"  (1.753 m)   Wt 84.2 kg   SpO2 97%   BMI 27.41 kg/m  Body mass index is 27.41 kg/m. Body surface area is 2.02 meters squared. General appearance: Alert, cooperative, in no acute distress, lying supine in bed Head: Normocephalic, atraumatic Eyes: Normal, EOM intact Ext: No edema in LE bilaterally, warm extremities  Neurologic exam:  Mental status: alertness:  alert,  affect: normal Speech: fluent and clear Motor:strength symmetric 5/5 in bilateral legs in all muscle groups Sensory: intact to light touch in bilateral lower extremities Gait: not tested   Imaging: MRI Lumbar Spine:  1. Broad based left subarticular/foraminal disc protrusion at L3-4, potentially affecting either the left L3 or descending left L4 nerve roots. Resultant severe canal and left lateral recess narrowing with moderate left L3 foraminal stenosis at this level. 2. Disc bulging with facet  hypertrophy at L2-3 with resultant mild canal and bilateral subarticular stenosis. 3. Chronic degenerative spondylolysis at L5-S1 with resultant mild to moderate left greater than right L5 foraminal stenosis.     Impression/Plan:  Mark Riggs is here with back pain that appears mostly musculoskeletal in nature. He doesn't have any sign of radiculopathy or neurogenic claudication at this time but it sounds like he may have had some in the past. I encouraged them to do physical therapy and we can follow up in clinic should he develop any leg symptoms that are worrisome. His back pain may take time to heal and I encouraged him to continue with medical management.    1.  Diagnosis: Lumbar stenosis, Back pain  2.  Plan - Follow up as needed as outpatient

## 2018-02-26 DIAGNOSIS — E559 Vitamin D deficiency, unspecified: Secondary | ICD-10-CM | POA: Diagnosis not present

## 2018-02-26 DIAGNOSIS — E538 Deficiency of other specified B group vitamins: Secondary | ICD-10-CM | POA: Diagnosis not present

## 2018-02-26 DIAGNOSIS — G20 Parkinson's disease: Secondary | ICD-10-CM | POA: Diagnosis not present

## 2018-02-27 ENCOUNTER — Other Ambulatory Visit: Payer: Self-pay | Admitting: Neurology

## 2018-02-27 DIAGNOSIS — G20 Parkinson's disease: Secondary | ICD-10-CM

## 2018-03-10 ENCOUNTER — Ambulatory Visit
Admission: RE | Admit: 2018-03-10 | Discharge: 2018-03-10 | Disposition: A | Discharge status: Home / Self Care | Payer: PPO | Source: Ambulatory Visit | Attending: Neurology | Admitting: Neurology

## 2018-03-10 DIAGNOSIS — G20 Parkinson's disease: Secondary | ICD-10-CM | POA: Diagnosis not present

## 2018-03-21 DIAGNOSIS — E119 Type 2 diabetes mellitus without complications: Secondary | ICD-10-CM | POA: Diagnosis not present

## 2018-03-21 DIAGNOSIS — E785 Hyperlipidemia, unspecified: Secondary | ICD-10-CM | POA: Diagnosis not present

## 2018-03-28 DIAGNOSIS — E785 Hyperlipidemia, unspecified: Secondary | ICD-10-CM | POA: Diagnosis not present

## 2018-03-28 DIAGNOSIS — E119 Type 2 diabetes mellitus without complications: Secondary | ICD-10-CM | POA: Diagnosis not present

## 2018-03-28 DIAGNOSIS — K210 Gastro-esophageal reflux disease with esophagitis: Secondary | ICD-10-CM | POA: Diagnosis not present

## 2018-03-28 DIAGNOSIS — F419 Anxiety disorder, unspecified: Secondary | ICD-10-CM | POA: Diagnosis not present

## 2018-06-27 DIAGNOSIS — G20 Parkinson's disease: Secondary | ICD-10-CM | POA: Diagnosis not present

## 2018-07-30 ENCOUNTER — Ambulatory Visit
Admission: EM | Admit: 2018-07-30 | Discharge: 2018-07-30 | Disposition: A | Discharge status: Home / Self Care | Payer: PPO

## 2018-07-30 ENCOUNTER — Other Ambulatory Visit: Payer: Self-pay

## 2018-07-30 DIAGNOSIS — B372 Candidiasis of skin and nail: Secondary | ICD-10-CM

## 2018-07-30 MED ORDER — CLOTRIMAZOLE 1 % EX CREA: TOPICAL_CREAM | CUTANEOUS | 0 refills | Status: DC

## 2018-07-30 MED ORDER — HYDROCORTISONE 2.5 % EX LOTN: TOPICAL_LOTION | CUTANEOUS | 0 refills | Status: DC

## 2018-07-30 NOTE — Discharge Instructions (Signed)
Take medication as prescribed.  Keep clean and dry.  Avoid frequent moisture.  Follow up with your primary care physician this week as needed. Return to Urgent care for new or worsening concerns.

## 2018-07-30 NOTE — ED Triage Notes (Signed)
Patient complains of rash between his legs that he thinks that might have started from condensation and being hot outside. Patient states that he thinks this has been going on for a while.

## 2018-07-30 NOTE — ED Provider Notes (Signed)
[facility] ____________________________________________  Time seen: Approximately 12:19 PM  I have reviewed the triage vital signs and the nursing notes.   HISTORY  Chief Complaint Rash   HPI Mark Riggs is a 78 y.o. male presenting for evaluation of left groin rash. Reports rash has been gradual in onset over approximately 1 week.  States rash feels raw.  Did have some itching to the area.  Denies known trigger, but reports he has been sweating a lot and the area has been moist more frequently than normal.  Did recently have chiggers, which she reports is fully resolved.  Denies any other atypical recent rashes or skin changes.  Denies diabetes. Denies other insect bites. No dysuria or penile or testicular pain. No fevers.  Denies aggravating or alleviating factors.  Reports otherwise doing well denies other complaints.   Past Medical History:  Diagnosis Date  . BPH (benign prostatic hyperplasia)   . Hyperlipemia   . Hypertension   . Pre-diabetes   . Renal cancer ([place]) 2007   left  . Renal mass     Patient Active Problem List   Diagnosis Date Noted  . Left flank pain 12/02/2017    Past Surgical History:  Procedure Laterality Date  . APPENDECTOMY    . RENAL MASS EXCISION  2008  . TRANSURETHRAL RESECTION OF PROSTATE     patient unsure about this surgery long time ago by Dr. Ernst Spell     No current facility-administered medications for this encounter.   Current Outpatient Medications:  .  ALPRAZolam (XANAX) 1 MG tablet, Take 1 mg by mouth 3 (three) times daily as needed for anxiety., Disp: , Rfl:  .  aspirin 81 MG tablet, Take 81 mg by mouth daily., Disp: , Rfl:  .  carbidopa-levodopa (SINEMET IR) 25-100 MG tablet, , Disp: , Rfl:  .  glimepiride (AMARYL) 2 MG tablet, Take 2 mg by mouth daily with breakfast., Disp: , Rfl:  .  lisinopril (PRINIVIL,ZESTRIL) 5 MG tablet, Take 5 mg by mouth daily., Disp: , Rfl:  .  simvastatin (ZOCOR) 40 MG tablet, Take 40  mg by mouth daily., Disp: , Rfl:  .  clotrimazole (LOTRIMIN) 1 % cream, Apply to affected area 2 times daily for 10 days, Disp: 15 g, Rfl: 0 .  hydrocortisone 2.5 % lotion, Apply twice daily for 1 week, then once daily for 1 week., Disp: 59 mL, Rfl: 0 .  methocarbamol (ROBAXIN) 500 MG tablet, Take 1 tablet (500 mg total) by mouth every 6 (six) hours as needed for muscle spasms., Disp: 20 tablet, Rfl: 0  Allergies Contrast media [iodinated diagnostic agents], Sulfa antibiotics, and Tetracyclines & related  History reviewed. No pertinent family history.  Social History Social History   Tobacco Use  . Smoking status: Former Research scientist (life sciences)  . Smokeless tobacco: Never Used  Substance Use Topics  . Alcohol use: No    Alcohol/week: 0.0 standard drinks  . Drug use: No    Review of Systems Constitutional: No fever ENT: No sore throat. Cardiovascular: Denies chest pain. Respiratory: Denies shortness of breath. Gastrointestinal: No abdominal pain.  No nausea, no vomiting.  No diarrhea.   Musculoskeletal: Negative for back pain. Skin: Positive for rash.   ____________________________________________   PHYSICAL EXAM:  VITAL SIGNS: ED Triage Vitals  Enc Vitals Group     BP 07/30/18 1038 116/79     Pulse Rate 07/30/18 1038 63     Resp 07/30/18 1038 19     Temp 07/30/18 1038  98.1 F (36.7 C)     Temp Source 07/30/18 1038 Oral     SpO2 07/30/18 1038 100 %     Weight 07/30/18 1036 185 lb (83.9 kg)     Height 07/30/18 1036 5\' 8"  (1.727 m)     Head Circumference --      Peak Flow --      Pain Score 07/30/18 1036 0     Pain Loc --      Pain Edu? --      Excl. in [place]? --     Constitutional: Alert and oriented. Well appearing and in no acute distress. Eyes: Conjunctivae are normal.  ENT      Head: Normocephalic and atraumatic. Cardiovascular: Normal rate, regular rhythm. Grossly normal heart sounds.  Good peripheral circulation. Respiratory: Normal respiratory effort without tachypnea  nor retractions. Breath sounds are clear and equal bilaterally. No wheezes, rales, rhonchi. Musculoskeletal: Steady gait. Neurologic:  Normal speech and language. Speech is normal. No gait instability.  Skin:  Skin is warm, dry.  Except: Patient declined chaperone. Left inner thigh and inguinal crease patch area of moist erythema, no further surrounding erythema, nontender, no drainage. Psychiatric: Mood and affect are normal. Speech and behavior are normal. Patient exhibits appropriate insight and judgment   ___________________________________________   LABS (all labs ordered are listed, but only abnormal results are displayed)  Labs Reviewed - No data to display   PROCEDURES Procedures    INITIAL IMPRESSION / ASSESSMENT AND PLAN / ED COURSE  Pertinent labs & imaging results that were available during my care of the patient were reviewed by me and considered in my medical decision making (see chart for details).  Well appearing patient. Rash appearance consistent with skin candidiasis.  Will treat with clotrimazole and hydrocortisone.  Continue to encourage patient to keep area dry and allow air.  Monitor, supportive care.Discussed indication, risks and benefits of medications with patient.  Discussed follow up with Primary care physician this week. Discussed follow up and return parameters including no resolution or any worsening concerns. Patient verbalized understanding and agreed to plan.   ____________________________________________   FINAL CLINICAL IMPRESSION(S) / ED DIAGNOSES  Final diagnoses:  Skin candidiasis     ED Discharge Orders         Ordered    clotrimazole (LOTRIMIN) 1 % cream     07/30/18 1135    hydrocortisone 2.5 % lotion     07/30/18 1135           Note: This dictation was prepared with Dragon dictation along with smaller phrase technology. Any transcriptional errors that result from this process are unintentional.         Marylene Land, NP 07/30/18 1433

## 2018-08-19 DIAGNOSIS — M48062 Spinal stenosis, lumbar region with neurogenic claudication: Secondary | ICD-10-CM | POA: Diagnosis not present

## 2018-08-25 DIAGNOSIS — M6281 Muscle weakness (generalized): Secondary | ICD-10-CM | POA: Diagnosis not present

## 2018-08-25 DIAGNOSIS — M48062 Spinal stenosis, lumbar region with neurogenic claudication: Secondary | ICD-10-CM | POA: Diagnosis not present

## 2018-09-01 DIAGNOSIS — M6281 Muscle weakness (generalized): Secondary | ICD-10-CM | POA: Diagnosis not present

## 2018-09-01 DIAGNOSIS — M48062 Spinal stenosis, lumbar region with neurogenic claudication: Secondary | ICD-10-CM | POA: Diagnosis not present

## 2018-09-02 ENCOUNTER — Other Ambulatory Visit (HOSPITAL_COMMUNITY): Payer: Self-pay | Admitting: Neurosurgery

## 2018-09-02 ENCOUNTER — Other Ambulatory Visit: Payer: Self-pay | Admitting: Neurosurgery

## 2018-09-02 DIAGNOSIS — M4807 Spinal stenosis, lumbosacral region: Secondary | ICD-10-CM | POA: Diagnosis not present

## 2018-09-02 DIAGNOSIS — M48062 Spinal stenosis, lumbar region with neurogenic claudication: Secondary | ICD-10-CM | POA: Diagnosis not present

## 2018-09-11 ENCOUNTER — Ambulatory Visit
Admission: RE | Admit: 2018-09-11 | Discharge: 2018-09-11 | Disposition: A | Discharge status: Home / Self Care | Payer: PPO | Source: Ambulatory Visit | Attending: Neurosurgery | Admitting: Neurosurgery

## 2018-09-11 ENCOUNTER — Other Ambulatory Visit: Payer: Self-pay

## 2018-09-11 DIAGNOSIS — M545 Low back pain: Secondary | ICD-10-CM | POA: Diagnosis not present

## 2018-09-11 DIAGNOSIS — M4807 Spinal stenosis, lumbosacral region: Secondary | ICD-10-CM | POA: Insufficient documentation

## 2018-09-16 ENCOUNTER — Other Ambulatory Visit: Payer: Self-pay

## 2018-09-16 ENCOUNTER — Encounter
Admission: RE | Admit: 2018-09-16 | Discharge: 2018-09-16 | Disposition: A | Discharge status: Home / Self Care | Payer: PPO | Source: Ambulatory Visit | Attending: Neurosurgery | Admitting: Neurosurgery

## 2018-09-16 DIAGNOSIS — I10 Essential (primary) hypertension: Secondary | ICD-10-CM | POA: Insufficient documentation

## 2018-09-16 DIAGNOSIS — Z20828 Contact with and (suspected) exposure to other viral communicable diseases: Secondary | ICD-10-CM | POA: Diagnosis not present

## 2018-09-16 DIAGNOSIS — Z01818 Encounter for other preprocedural examination: Secondary | ICD-10-CM | POA: Diagnosis not present

## 2018-09-16 LAB — CBC
HCT: 43.1 % (ref 39.0–52.0)
Hemoglobin: 14.9 g/dL (ref 13.0–17.0)
MCH: 30.7 pg (ref 26.0–34.0)
MCHC: 34.6 g/dL (ref 30.0–36.0)
MCV: 88.9 fL (ref 80.0–100.0)
Platelets: 148 10*3/uL — ABNORMAL LOW (ref 150–400)
RBC: 4.85 MIL/uL (ref 4.22–5.81)
RDW: 13 % (ref 11.5–15.5)
WBC: 8.7 10*3/uL (ref 4.0–10.5)
nRBC: 0 % (ref 0.0–0.2)

## 2018-09-16 LAB — URINALYSIS, ROUTINE W REFLEX MICROSCOPIC
Bilirubin Urine: NEGATIVE
Glucose, UA: NEGATIVE mg/dL
Hgb urine dipstick: NEGATIVE
Ketones, ur: 5 mg/dL — AB
Leukocytes,Ua: NEGATIVE
Nitrite: NEGATIVE
Protein, ur: NEGATIVE mg/dL
Specific Gravity, Urine: 1.025 (ref 1.005–1.030)
pH: 5 (ref 5.0–8.0)

## 2018-09-16 LAB — BASIC METABOLIC PANEL
Anion gap: 10 (ref 5–15)
BUN: 24 mg/dL — ABNORMAL HIGH (ref 8–23)
CO2: 26 mmol/L (ref 22–32)
Calcium: 9 mg/dL (ref 8.9–10.3)
Chloride: 100 mmol/L (ref 98–111)
Creatinine, Ser: 1.1 mg/dL (ref 0.61–1.24)
GFR calc Af Amer: >60 mL/min (ref >60)
GFR calc non Af Amer: >60 mL/min (ref >60)
Glucose, Bld: 116 mg/dL — ABNORMAL HIGH (ref 70–99)
Potassium: 3.8 mmol/L (ref 3.5–5.1)
Sodium: 136 mmol/L (ref 135–145)

## 2018-09-16 LAB — PROTIME-INR
INR: 1 (ref 0.8–1.2)
Prothrombin Time: 13.3 seconds (ref 11.4–15.2)

## 2018-09-16 LAB — TYPE AND SCREEN
ABO/RH(D): O POS
Antibody Screen: NEGATIVE

## 2018-09-16 LAB — APTT: aPTT: 34 seconds (ref 24–36)

## 2018-09-16 NOTE — Patient Instructions (Signed)
Your procedure is scheduled on: September 22, 2018 Monday  Report to Day Surgery on the 2nd floor of the [facility]. To find out your arrival time, please call [phone] between 1PM - 3PM on: Friday September 19, 2018  REMEMBER: Instructions that are not followed completely may result in serious medical risk, up to and including death; or upon the discretion of your surgeon and anesthesiologist your surgery may need to be rescheduled.  Do not eat food after midnight the night before surgery.  No gum chewing, lozengers or hard candies.  You may however, drink CLEAR liquids up to 2 hours before you are scheduled to arrive for your surgery. Do not drink anything within 2 hours of the start of your surgery.  Clear liquids include: - water   Do NOT drink anything that is not on this list.  Type 1 and Type 2 diabetics should only drink water.  No Alcohol for 24 hours before or after surgery.  No Smoking including e-cigarettes for 24 hours prior to surgery.  No chewable tobacco products for at least 6 hours prior to surgery.  No nicotine patches on the day of surgery.  On the morning of surgery brush your teeth with toothpaste and water, you may rinse your mouth with mouthwash if you wish. Do not swallow any toothpaste or mouthwash.  Notify your doctor if there is any change in your medical condition (cold, fever, infection).  Do not wear jewelry, make-up, hairpins, clips or nail polish.  Do not wear lotions, powders, or perfumes.   Do not shave 48 hours prior to surgery.   Contacts and dentures may not be worn into surgery.  Do not bring valuables to the hospital, including drivers license, insurance or credit cards.  [facility] is not responsible for any belongings or valuables.   TAKE THESE MEDICATIONS THE MORNING OF SURGERY: XANAX IF NEEDED  SINEMET  DO NOT TAKE LISINOPRIL MORNING OF SURGERY.  Use CHG Soap  as directed on instruction sheet.  Follow  recommendations from Cardiologist, Pulmonologist or PCP regarding stopping Aspirin. STOP ASPIRIN TODAY.  Stop Anti-inflammatories (NSAIDS) such as Advil, Aleve, Ibuprofen, Motrin, Naproxen, Naprosyn and Aspirin based products such as Excedrin, Goodys Powder, BC Powder. (May take Tylenol or Acetaminophen if needed.)  Stop ANY OVER THE COUNTER supplements until after surgery. (May continue Vitamin D, Vitamin B, and multivitamin.)  Wear comfortable clothing (specific to your surgery type) to the hospital.  Plan for stool softeners for home use.  If you are being admitted to the hospital overnight, leave your suitcase in the car. After surgery it may be brought to your room.  If you are being discharged the day of surgery, you will not be allowed to drive home. You will need a responsible adult to drive you home and stay with you that night.   If you are taking public transportation, you will need to have a responsible adult with you. Please confirm with your physician that it is acceptable to use public transportation.   Please call [phone] if you have any questions about these instructions.

## 2018-09-18 ENCOUNTER — Other Ambulatory Visit: Payer: Self-pay

## 2018-09-18 ENCOUNTER — Other Ambulatory Visit
Admission: RE | Admit: 2018-09-18 | Discharge: 2018-09-18 | Disposition: A | Discharge status: Home / Self Care | Payer: PPO | Source: Ambulatory Visit | Attending: Neurosurgery | Admitting: Neurosurgery

## 2018-09-18 DIAGNOSIS — Z01818 Encounter for other preprocedural examination: Secondary | ICD-10-CM | POA: Diagnosis not present

## 2018-09-18 LAB — SARS CORONAVIRUS 2 (TAT 6-24 HRS): SARS Coronavirus 2: NEGATIVE

## 2018-09-22 ENCOUNTER — Ambulatory Visit: Payer: PPO

## 2018-09-22 ENCOUNTER — Encounter: Payer: Self-pay | Admitting: *Deleted

## 2018-09-22 ENCOUNTER — Other Ambulatory Visit: Payer: Self-pay

## 2018-09-22 ENCOUNTER — Observation Stay
Admission: AD | Admit: 2018-09-22 | Discharge: 2018-09-23 | Disposition: A | Discharge status: Home / Self Care | Payer: PPO | Attending: Neurosurgery | Admitting: Neurosurgery

## 2018-09-22 ENCOUNTER — Encounter
Admission: AD | Disposition: A | Discharge status: Home / Self Care | Payer: Self-pay | Source: Home / Self Care | Attending: Neurosurgery

## 2018-09-22 ENCOUNTER — Ambulatory Visit: Payer: PPO | Admitting: Certified Registered"

## 2018-09-22 DIAGNOSIS — Z7984 Long term (current) use of oral hypoglycemic drugs: Secondary | ICD-10-CM | POA: Diagnosis not present

## 2018-09-22 DIAGNOSIS — Z79899 Other long term (current) drug therapy: Secondary | ICD-10-CM | POA: Insufficient documentation

## 2018-09-22 DIAGNOSIS — Z981 Arthrodesis status: Secondary | ICD-10-CM | POA: Diagnosis not present

## 2018-09-22 DIAGNOSIS — Z882 Allergy status to sulfonamides status: Secondary | ICD-10-CM | POA: Insufficient documentation

## 2018-09-22 DIAGNOSIS — Z91041 Radiographic dye allergy status: Secondary | ICD-10-CM | POA: Insufficient documentation

## 2018-09-22 DIAGNOSIS — Z87891 Personal history of nicotine dependence: Secondary | ICD-10-CM | POA: Diagnosis not present

## 2018-09-22 DIAGNOSIS — E785 Hyperlipidemia, unspecified: Secondary | ICD-10-CM | POA: Insufficient documentation

## 2018-09-22 DIAGNOSIS — I10 Essential (primary) hypertension: Secondary | ICD-10-CM | POA: Diagnosis not present

## 2018-09-22 DIAGNOSIS — R7303 Prediabetes: Secondary | ICD-10-CM | POA: Diagnosis not present

## 2018-09-22 DIAGNOSIS — Z7982 Long term (current) use of aspirin: Secondary | ICD-10-CM | POA: Insufficient documentation

## 2018-09-22 DIAGNOSIS — Z85528 Personal history of other malignant neoplasm of kidney: Secondary | ICD-10-CM | POA: Diagnosis not present

## 2018-09-22 DIAGNOSIS — Z881 Allergy status to other antibiotic agents status: Secondary | ICD-10-CM | POA: Diagnosis not present

## 2018-09-22 DIAGNOSIS — Z9889 Other specified postprocedural states: Secondary | ICD-10-CM

## 2018-09-22 DIAGNOSIS — Z419 Encounter for procedure for purposes other than remedying health state, unspecified: Secondary | ICD-10-CM

## 2018-09-22 DIAGNOSIS — E119 Type 2 diabetes mellitus without complications: Secondary | ICD-10-CM | POA: Diagnosis not present

## 2018-09-22 DIAGNOSIS — M48062 Spinal stenosis, lumbar region with neurogenic claudication: Secondary | ICD-10-CM | POA: Diagnosis not present

## 2018-09-22 DIAGNOSIS — G20 Parkinson's disease: Secondary | ICD-10-CM | POA: Insufficient documentation

## 2018-09-22 DIAGNOSIS — Z23 Encounter for immunization: Secondary | ICD-10-CM | POA: Insufficient documentation

## 2018-09-22 DIAGNOSIS — M48061 Spinal stenosis, lumbar region without neurogenic claudication: Secondary | ICD-10-CM | POA: Diagnosis not present

## 2018-09-22 HISTORY — PX: LUMBAR LAMINECTOMY/ DECOMPRESSION WITH MET-RX: SHX5959

## 2018-09-22 HISTORY — DX: Type 2 diabetes mellitus without complications: E11.9

## 2018-09-22 LAB — GLUCOSE, CAPILLARY
Glucose-Capillary: 135 mg/dL — ABNORMAL HIGH (ref 70–99)
Glucose-Capillary: 135 mg/dL — ABNORMAL HIGH (ref 70–99)

## 2018-09-22 LAB — ABO/RH: ABO/RH(D): O POS

## 2018-09-22 SURGERY — LUMBAR LAMINECTOMY/ DECOMPRESSION WITH MET-RX
Anesthesia: General

## 2018-09-22 MED ORDER — ACETAMINOPHEN 500 MG PO TABS
1000.0000 mg | ORAL_TABLET | Freq: Four times a day (QID) | ORAL | Status: AC
  Administered 2018-09-22 – 2018-09-23 (×4): 1000 mg via ORAL
  Filled 2018-09-22 (×4): qty 2

## 2018-09-22 MED ORDER — INFLUENZA VAC A&B SA ADJ QUAD 0.5 ML IM PRSY
0.5000 mL | PREFILLED_SYRINGE | INTRAMUSCULAR | Status: AC
  Administered 2018-09-23: 0.5 mL via INTRAMUSCULAR
  Filled 2018-09-22: qty 0.5

## 2018-09-22 MED ORDER — FENTANYL CITRATE (PF) 100 MCG/2ML IJ SOLN
INTRAMUSCULAR | Status: AC
  Administered 2018-09-22: 25 ug via INTRAVENOUS
  Filled 2018-09-22: qty 2

## 2018-09-22 MED ORDER — LIDOCAINE HCL (PF) 2 % IJ SOLN
INTRAMUSCULAR | Status: AC
  Filled 2018-09-22: qty 10

## 2018-09-22 MED ORDER — SUGAMMADEX SODIUM 200 MG/2ML IV SOLN
INTRAVENOUS | Status: AC
  Filled 2018-09-22: qty 2

## 2018-09-22 MED ORDER — BUPIVACAINE HCL (PF) 0.5 % IJ SOLN
INTRAMUSCULAR | Status: AC
  Filled 2018-09-22: qty 30

## 2018-09-22 MED ORDER — ROCURONIUM BROMIDE 50 MG/5ML IV SOLN
INTRAVENOUS | Status: AC
  Filled 2018-09-22: qty 1

## 2018-09-22 MED ORDER — PHENYLEPHRINE HCL (PRESSORS) 10 MG/ML IV SOLN
INTRAVENOUS | Status: AC
  Filled 2018-09-22: qty 1

## 2018-09-22 MED ORDER — OXYCODONE HCL 5 MG/5ML PO SOLN: 5.0000 mg | Freq: Once | ORAL | Status: DC | PRN

## 2018-09-22 MED ORDER — LISINOPRIL 5 MG PO TABS
2.5000 mg | ORAL_TABLET | Freq: Every day | ORAL | Status: DC
  Administered 2018-09-23: 11:00:00 2.5 mg via ORAL
  Filled 2018-09-22: qty 1

## 2018-09-22 MED ORDER — SODIUM CHLORIDE 0.9% FLUSH: 3.0000 mL | INTRAVENOUS | Status: DC | PRN

## 2018-09-22 MED ORDER — OXYCODONE HCL 5 MG PO TABS: 5.0000 mg | ORAL_TABLET | Freq: Once | ORAL | Status: DC | PRN

## 2018-09-22 MED ORDER — GLYCOPYRROLATE 0.2 MG/ML IJ SOLN
INTRAMUSCULAR | Status: DC | PRN
  Administered 2018-09-22: 0.2 mg via INTRAVENOUS

## 2018-09-22 MED ORDER — CARBIDOPA-LEVODOPA 25-100 MG PO TABS
2.0000 | ORAL_TABLET | Freq: Three times a day (TID) | ORAL | Status: DC
  Administered 2018-09-22 – 2018-09-23 (×3): 2 via ORAL
  Filled 2018-09-22 (×5): qty 2

## 2018-09-22 MED ORDER — FENTANYL CITRATE (PF) 100 MCG/2ML IJ SOLN
INTRAMUSCULAR | Status: AC
  Filled 2018-09-22: qty 2

## 2018-09-22 MED ORDER — ACETAMINOPHEN 10 MG/ML IV SOLN
INTRAVENOUS | Status: DC | PRN
  Administered 2018-09-22: 1000 mg via INTRAVENOUS

## 2018-09-22 MED ORDER — THROMBIN 5000 UNITS EX SOLR
CUTANEOUS | Status: AC
  Filled 2018-09-22: qty 5000

## 2018-09-22 MED ORDER — SODIUM CHLORIDE 0.9 % IV SOLN
INTRAVENOUS | Status: DC
  Administered 2018-09-22 – 2018-09-23 (×2): via INTRAVENOUS

## 2018-09-22 MED ORDER — MAGNESIUM CITRATE PO SOLN
1.0000 | Freq: Once | ORAL | Status: DC | PRN
  Filled 2018-09-22: qty 296

## 2018-09-22 MED ORDER — PROPOFOL 10 MG/ML IV BOLUS
INTRAVENOUS | Status: AC
  Filled 2018-09-22: qty 20

## 2018-09-22 MED ORDER — MIDAZOLAM HCL 2 MG/2ML IJ SOLN
INTRAMUSCULAR | Status: AC
  Filled 2018-09-22: qty 2

## 2018-09-22 MED ORDER — ACETAMINOPHEN 325 MG PO TABS: 650.0000 mg | ORAL_TABLET | ORAL | Status: DC | PRN

## 2018-09-22 MED ORDER — POLYETHYLENE GLYCOL 3350 17 G PO PACK: 17.0000 g | PACK | Freq: Every day | ORAL | Status: DC | PRN

## 2018-09-22 MED ORDER — GLIMEPIRIDE 2 MG PO TABS
2.0000 mg | ORAL_TABLET | Freq: Every day | ORAL | Status: DC
  Administered 2018-09-23: 2 mg via ORAL
  Filled 2018-09-22: qty 1

## 2018-09-22 MED ORDER — DEXAMETHASONE SODIUM PHOSPHATE 10 MG/ML IJ SOLN
INTRAMUSCULAR | Status: DC | PRN
  Administered 2018-09-22: 10 mg via INTRAVENOUS

## 2018-09-22 MED ORDER — SODIUM CHLORIDE 0.9 % IV SOLN
INTRAVENOUS | Status: DC
  Administered 2018-09-22 (×2): via INTRAVENOUS

## 2018-09-22 MED ORDER — CEFAZOLIN SODIUM-DEXTROSE 2-4 GM/100ML-% IV SOLN
INTRAVENOUS | Status: AC
  Filled 2018-09-22: qty 100

## 2018-09-22 MED ORDER — FAMOTIDINE 20 MG PO TABS
ORAL_TABLET | ORAL | Status: AC
  Administered 2018-09-22: 09:00:00 20 mg via ORAL
  Filled 2018-09-22: qty 1

## 2018-09-22 MED ORDER — BISACODYL 5 MG PO TBEC: 5.0000 mg | DELAYED_RELEASE_TABLET | Freq: Every day | ORAL | Status: DC | PRN

## 2018-09-22 MED ORDER — SIMVASTATIN 20 MG PO TABS: 40.0000 mg | ORAL_TABLET | Freq: Every day | ORAL | Status: DC

## 2018-09-22 MED ORDER — MENTHOL 3 MG MT LOZG
1.0000 | LOZENGE | OROMUCOSAL | Status: DC | PRN
  Filled 2018-09-22: qty 9

## 2018-09-22 MED ORDER — OXYCODONE HCL 5 MG PO TABS: 10.0000 mg | ORAL_TABLET | ORAL | Status: DC | PRN

## 2018-09-22 MED ORDER — ALPRAZOLAM 0.5 MG PO TABS
1.0000 mg | ORAL_TABLET | Freq: Three times a day (TID) | ORAL | Status: DC | PRN
  Administered 2018-09-22: 22:00:00 1 mg via ORAL
  Filled 2018-09-22 (×2): qty 2

## 2018-09-22 MED ORDER — METHOCARBAMOL 1000 MG/10ML IJ SOLN
500.0000 mg | Freq: Four times a day (QID) | INTRAVENOUS | Status: DC
  Filled 2018-09-22: qty 5

## 2018-09-22 MED ORDER — SUCCINYLCHOLINE CHLORIDE 20 MG/ML IJ SOLN
INTRAMUSCULAR | Status: AC
  Filled 2018-09-22: qty 1

## 2018-09-22 MED ORDER — METHOCARBAMOL 500 MG PO TABS
500.0000 mg | ORAL_TABLET | Freq: Four times a day (QID) | ORAL | Status: DC
  Administered 2018-09-22 – 2018-09-23 (×3): 500 mg via ORAL
  Filled 2018-09-22 (×3): qty 1

## 2018-09-22 MED ORDER — ROCURONIUM BROMIDE 100 MG/10ML IV SOLN
INTRAVENOUS | Status: DC | PRN
  Administered 2018-09-22: 30 mg via INTRAVENOUS

## 2018-09-22 MED ORDER — THROMBIN 5000 UNITS EX SOLR
CUTANEOUS | Status: DC | PRN
  Administered 2018-09-22: 5000 [IU] via TOPICAL

## 2018-09-22 MED ORDER — DEXMEDETOMIDINE HCL 200 MCG/2ML IV SOLN
INTRAVENOUS | Status: DC | PRN
  Administered 2018-09-22: 8 ug via INTRAVENOUS

## 2018-09-22 MED ORDER — SODIUM CHLORIDE 0.9% FLUSH: 3.0000 mL | Freq: Two times a day (BID) | INTRAVENOUS | Status: DC

## 2018-09-22 MED ORDER — LIDOCAINE-EPINEPHRINE (PF) 1 %-1:200000 IJ SOLN
INTRAMUSCULAR | Status: AC
  Filled 2018-09-22: qty 30

## 2018-09-22 MED ORDER — ONDANSETRON HCL 4 MG/2ML IJ SOLN: 4.0000 mg | Freq: Four times a day (QID) | INTRAMUSCULAR | Status: DC | PRN

## 2018-09-22 MED ORDER — METHYLPREDNISOLONE ACETATE 40 MG/ML IJ SUSP
INTRAMUSCULAR | Status: AC
  Filled 2018-09-22: qty 1

## 2018-09-22 MED ORDER — ACETAMINOPHEN 10 MG/ML IV SOLN
INTRAVENOUS | Status: AC
  Filled 2018-09-22: qty 100

## 2018-09-22 MED ORDER — MEPERIDINE HCL 50 MG/ML IJ SOLN: 6.2500 mg | INTRAMUSCULAR | Status: DC | PRN

## 2018-09-22 MED ORDER — LIDOCAINE-EPINEPHRINE (PF) 1 %-1:200000 IJ SOLN
INTRAMUSCULAR | Status: DC | PRN
  Administered 2018-09-22: 10 mL

## 2018-09-22 MED ORDER — GLYCOPYRROLATE 0.2 MG/ML IJ SOLN
INTRAMUSCULAR | Status: AC
  Filled 2018-09-22: qty 1

## 2018-09-22 MED ORDER — ONDANSETRON HCL 4 MG/2ML IJ SOLN
INTRAMUSCULAR | Status: AC
  Filled 2018-09-22: qty 2

## 2018-09-22 MED ORDER — HYDROMORPHONE HCL 1 MG/ML IJ SOLN: 0.5000 mg | INTRAMUSCULAR | Status: DC | PRN

## 2018-09-22 MED ORDER — ACETAMINOPHEN 650 MG RE SUPP: 650.0000 mg | RECTAL | Status: DC | PRN

## 2018-09-22 MED ORDER — SENNA 8.6 MG PO TABS
1.0000 | ORAL_TABLET | Freq: Two times a day (BID) | ORAL | Status: DC
  Administered 2018-09-22 – 2018-09-23 (×3): 8.6 mg via ORAL
  Filled 2018-09-22 (×3): qty 1

## 2018-09-22 MED ORDER — SUCCINYLCHOLINE CHLORIDE 20 MG/ML IJ SOLN
INTRAMUSCULAR | Status: DC | PRN
  Administered 2018-09-22: 100 mg via INTRAVENOUS

## 2018-09-22 MED ORDER — SODIUM CHLORIDE 0.9 % IV SOLN: 250.0000 mL | INTRAVENOUS | Status: DC

## 2018-09-22 MED ORDER — SUGAMMADEX SODIUM 500 MG/5ML IV SOLN
INTRAVENOUS | Status: DC | PRN
  Administered 2018-09-22: 200 mg via INTRAVENOUS

## 2018-09-22 MED ORDER — FENTANYL CITRATE (PF) 100 MCG/2ML IJ SOLN
25.0000 ug | INTRAMUSCULAR | Status: DC | PRN
  Administered 2018-09-22 (×3): 25 ug via INTRAVENOUS

## 2018-09-22 MED ORDER — EPHEDRINE SULFATE 50 MG/ML IJ SOLN
INTRAMUSCULAR | Status: AC
  Filled 2018-09-22: qty 1

## 2018-09-22 MED ORDER — FAMOTIDINE 20 MG PO TABS
20.0000 mg | ORAL_TABLET | Freq: Once | ORAL | Status: AC
  Administered 2018-09-22: 09:00:00 20 mg via ORAL

## 2018-09-22 MED ORDER — PROMETHAZINE HCL 25 MG/ML IJ SOLN: 6.2500 mg | INTRAMUSCULAR | Status: DC | PRN

## 2018-09-22 MED ORDER — EPHEDRINE SULFATE 50 MG/ML IJ SOLN
INTRAMUSCULAR | Status: DC | PRN
  Administered 2018-09-22: 5 mg via INTRAVENOUS

## 2018-09-22 MED ORDER — SIMVASTATIN 20 MG PO TABS
40.0000 mg | ORAL_TABLET | Freq: Every day | ORAL | Status: DC
  Filled 2018-09-22: qty 2

## 2018-09-22 MED ORDER — OXYCODONE HCL 5 MG PO TABS
5.0000 mg | ORAL_TABLET | ORAL | Status: DC | PRN
  Administered 2018-09-22: 5 mg via ORAL
  Filled 2018-09-22: qty 1

## 2018-09-22 MED ORDER — MIDAZOLAM HCL 2 MG/2ML IJ SOLN
INTRAMUSCULAR | Status: DC | PRN
  Administered 2018-09-22 (×2): 1 mg via INTRAVENOUS

## 2018-09-22 MED ORDER — ONDANSETRON HCL 4 MG/2ML IJ SOLN
INTRAMUSCULAR | Status: DC | PRN
  Administered 2018-09-22: 4 mg via INTRAVENOUS

## 2018-09-22 MED ORDER — REMIFENTANIL HCL 1 MG IV SOLR
INTRAVENOUS | Status: AC
  Filled 2018-09-22: qty 1000

## 2018-09-22 MED ORDER — FENTANYL CITRATE (PF) 100 MCG/2ML IJ SOLN
INTRAMUSCULAR | Status: DC | PRN
  Administered 2018-09-22 (×2): 50 ug via INTRAVENOUS

## 2018-09-22 MED ORDER — PROPOFOL 10 MG/ML IV BOLUS
INTRAVENOUS | Status: DC | PRN
  Administered 2018-09-22: 120 mg via INTRAVENOUS

## 2018-09-22 MED ORDER — LACTATED RINGERS IV SOLN: INTRAVENOUS | Status: DC | PRN

## 2018-09-22 MED ORDER — DEXAMETHASONE SODIUM PHOSPHATE 10 MG/ML IJ SOLN
INTRAMUSCULAR | Status: AC
  Filled 2018-09-22: qty 1

## 2018-09-22 MED ORDER — REMIFENTANIL HCL 1 MG IV SOLR
INTRAVENOUS | Status: DC | PRN
  Administered 2018-09-22: .1 ug/kg/min via INTRAVENOUS

## 2018-09-22 MED ORDER — CEFAZOLIN SODIUM-DEXTROSE 2-4 GM/100ML-% IV SOLN
2.0000 g | Freq: Once | INTRAVENOUS | Status: AC
  Administered 2018-09-22: 2 g via INTRAVENOUS

## 2018-09-22 MED ORDER — SODIUM CHLORIDE 0.9 % IV SOLN
INTRAVENOUS | Status: DC | PRN
  Administered 2018-09-22: 25 ug/min via INTRAVENOUS

## 2018-09-22 MED ORDER — ONDANSETRON HCL 4 MG PO TABS: 4.0000 mg | ORAL_TABLET | Freq: Four times a day (QID) | ORAL | Status: DC | PRN

## 2018-09-22 MED ORDER — PHENOL 1.4 % MT LIQD
1.0000 | OROMUCOSAL | Status: DC | PRN
  Filled 2018-09-22: qty 177

## 2018-09-22 SURGICAL SUPPLY — 59 items
BUR NEURO DRILL SOFT 3.0X3.8M (BURR) ×3 IMPLANT
CANISTER SUCT 1200ML W/VALVE (MISCELLANEOUS) ×6 IMPLANT
CHLORAPREP W/TINT 26 (MISCELLANEOUS) ×6 IMPLANT
COUNTER NEEDLE 20/40 LG (NEEDLE) ×3 IMPLANT
COVER LIGHT HANDLE STERIS (MISCELLANEOUS) ×6 IMPLANT
COVER WAND RF STERILE (DRAPES) ×3 IMPLANT
CUP MEDICINE 2OZ PLAST GRAD ST (MISCELLANEOUS) ×3 IMPLANT
DERMABOND ADVANCED (GAUZE/BANDAGES/DRESSINGS) ×2
DERMABOND ADVANCED .7 DNX12 (GAUZE/BANDAGES/DRESSINGS) ×1 IMPLANT
DRAPE C-ARM 42X72 X-RAY (DRAPES) ×6 IMPLANT
DRAPE LAPAROTOMY 100X77 ABD (DRAPES) ×3 IMPLANT
DRAPE MICROSCOPE SPINE 48X150 (DRAPES) IMPLANT
DRAPE SURG 17X11 SM STRL (DRAPES) ×3 IMPLANT
DRSG TEGADERM 4X4.75 (GAUZE/BANDAGES/DRESSINGS) IMPLANT
DRSG TELFA 4X3 1S NADH ST (GAUZE/BANDAGES/DRESSINGS) IMPLANT
DURASEAL APPLICATOR TIP (TIP) IMPLANT
DURASEAL SPINE SEALANT 3ML (MISCELLANEOUS) IMPLANT
ELECT CAUTERY BLADE TIP 2.5 (TIP) ×3
ELECT EZSTD 165MM 6.5IN (MISCELLANEOUS) ×3
ELECT REM PT RETURN 9FT ADLT (ELECTROSURGICAL) ×3
ELECTRODE CAUTERY BLDE TIP 2.5 (TIP) ×1 IMPLANT
ELECTRODE EZSTD 165MM 6.5IN (MISCELLANEOUS) ×1 IMPLANT
ELECTRODE REM PT RTRN 9FT ADLT (ELECTROSURGICAL) ×1 IMPLANT
GAUZE SPONGE 4X4 12PLY STRL (GAUZE/BANDAGES/DRESSINGS) ×3 IMPLANT
GLOVE BIOGEL PI IND STRL 7.0 (GLOVE) ×1 IMPLANT
GLOVE BIOGEL PI INDICATOR 7.0 (GLOVE) ×2
GLOVE INDICATOR 8.0 STRL GRN (GLOVE) ×9 IMPLANT
GLOVE SURG SYN 7.0 (GLOVE) ×6 IMPLANT
GLOVE SURG SYN 8.0 (GLOVE) ×3 IMPLANT
GOWN STRL REUS W/ TWL XL LVL3 (GOWN DISPOSABLE) ×1 IMPLANT
GOWN STRL REUS W/TWL MED LVL3 (GOWN DISPOSABLE) ×3 IMPLANT
GOWN STRL REUS W/TWL XL LVL3 (GOWN DISPOSABLE) ×2
GRADUATE 1200CC STRL 31836 (MISCELLANEOUS) ×3 IMPLANT
HOLDER FOLEY CATH W/STRAP (MISCELLANEOUS) ×3 IMPLANT
KIT TURNOVER KIT A (KITS) ×3 IMPLANT
KIT WILSON FRAME (KITS) ×3 IMPLANT
KNIFE BAYONET SHORT DISCETOMY (MISCELLANEOUS) IMPLANT
MARKER SKIN DUAL TIP RULER LAB (MISCELLANEOUS) ×6 IMPLANT
NDL SAFETY ECLIPSE 18X1.5 (NEEDLE) ×1 IMPLANT
NEEDLE HYPO 18GX1.5 SHARP (NEEDLE) ×2
NEEDLE HYPO 22GX1.5 SAFETY (NEEDLE) ×3 IMPLANT
NS IRRIG 1000ML POUR BTL (IV SOLUTION) ×3 IMPLANT
PACK LAMINECTOMY NEURO (CUSTOM PROCEDURE TRAY) ×3 IMPLANT
PAD ARMBOARD 7.5X6 YLW CONV (MISCELLANEOUS) ×3 IMPLANT
SPOGE SURGIFLO 8M (HEMOSTASIS) ×2
SPONGE KITTNER 5P (MISCELLANEOUS) ×3 IMPLANT
SPONGE SURGIFLO 8M (HEMOSTASIS) ×1 IMPLANT
STAPLER SKIN PROX 35W (STAPLE) IMPLANT
SUT NURALON 4 0 TR CR/8 (SUTURE) IMPLANT
SUT POLYSORB 2-0 5X18 GS-10 (SUTURE) ×6 IMPLANT
SUT VIC AB 0 CT1 18XCR BRD 8 (SUTURE) ×1 IMPLANT
SUT VIC AB 0 CT1 8-18 (SUTURE) ×2
SYR 10ML LL (SYRINGE) ×6 IMPLANT
SYR 30ML LL (SYRINGE) ×3 IMPLANT
SYR 3ML LL SCALE MARK (SYRINGE) ×3 IMPLANT
TOWEL OR 17X26 4PK STRL BLUE (TOWEL DISPOSABLE) ×12 IMPLANT
TRAY FOLEY SLVR 16FR LF STAT (SET/KITS/TRAYS/PACK) ×3 IMPLANT
TUBING CONNECTING 10 (TUBING) ×2 IMPLANT
TUBING CONNECTING 10' (TUBING) ×1

## 2018-09-22 NOTE — Transfer of Care (Signed)
Immediate Anesthesia Transfer of Care Note  Patient: Mark Riggs  Procedure(s) Performed: L3-4 LAMINECTOMY (N/A )  Patient Location: PACU  Anesthesia Type:General  Level of Consciousness: awake, oriented and patient cooperative  Airway & Oxygen Therapy: Patient Spontanous Breathing and Patient connected to face mask oxygen  Post-op Assessment: Report given to RN and Post -op Vital signs reviewed and stable  Post vital signs: Reviewed and stable  Last Vitals:  Vitals Value Taken Time  BP 143/84 09/22/18 1244  Temp 36.8 C 09/22/18 1244  Pulse 91 09/22/18 1247  Resp 12 09/22/18 1247  SpO2 100 % 09/22/18 1247  Vitals shown include unvalidated device data.  Last Pain:  Vitals:   09/22/18 0917  TempSrc: Tympanic  PainSc: 0-No pain         Complications: No apparent anesthesia complications

## 2018-09-22 NOTE — Anesthesia Preprocedure Evaluation (Signed)
Anesthesia Evaluation  Patient identified by MRN, date of birth, ID band Patient awake    Reviewed: Allergy & Precautions, NPO status , Patient's Chart, lab work & pertinent test results  History of Anesthesia Complications Negative for: history of anesthetic complications  Airway Mallampati: III  TM Distance: >3 FB Neck ROM: Full    Dental  (+) Poor Dentition   Pulmonary neg sleep apnea, neg COPD, former smoker,    breath sounds clear to auscultation- rhonchi (-) wheezing      Cardiovascular hypertension, Pt. on medications (-) CAD, (-) Past MI, (-) Cardiac Stents and (-) CABG  Rhythm:Regular Rate:Normal - Systolic murmurs and - Diastolic murmurs    Neuro/Psych neg Seizures negative neurological ROS  negative psych ROS   GI/Hepatic negative GI ROS, Neg liver ROS,   Endo/Other  diabetes, Oral Hypoglycemic Agents  Renal/GU Renal disease     Musculoskeletal negative musculoskeletal ROS (+)   Abdominal (+) - obese,   Peds  Hematology negative hematology ROS (+)   Anesthesia Other Findings Past Medical History: No date: BPH (benign prostatic hyperplasia) No date: Hyperlipemia No date: Hypertension No date: Pre-diabetes 2007: Renal cancer ([place])     Comment:  left No date: Renal mass   Reproductive/Obstetrics                             Anesthesia Physical Anesthesia Plan  ASA: III  Anesthesia Plan: General   Post-op Pain Management:    Induction: Intravenous  PONV Risk Score and Plan: 1 and Ondansetron and Dexamethasone  Airway Management Planned: Oral ETT  Additional Equipment:   Intra-op Plan:   Post-operative Plan: Extubation in OR  Informed Consent: I have reviewed the patients History and Physical, chart, labs and discussed the procedure including the risks, benefits and alternatives for the proposed anesthesia with the patient or authorized representative who has  indicated his/her understanding and acceptance.     Dental advisory given  Plan Discussed with: CRNA and Anesthesiologist  Anesthesia Plan Comments:         Anesthesia Quick Evaluation

## 2018-09-22 NOTE — Op Note (Signed)
Operative Note  SURGERY DATE:09/22/2018  PRE-OP DIAGNOSIS: Lumbar Stenosis with Neurogenic Claudication(m48.06)  POST-OP DIAGNOSIS:Post-Op Diagnosis Codes: Lumbar Stenosiswith Neurogenic Claudication (m48.06)  Procedure(s) with comments: L3/4 Laminectomy, Lateral recess decompression   SURGEON:  * Malen Gauze, MD   Marin Olp, PA, Assistant  ANESTHESIA:General  OPERATIVE FINDINGS: Hypertrophied Ligament andLateral Recess Stenosis,Compressionat L3/4  OPERATIVE REPORT:   Indication: Mark Riggs presented to the clinic on8/25with ongoing bilaterallegnumbnessand pain worsenedwith ambulation. He had failed conservative management including PT and prescription medications. MRI revealed stenosis at L3/4.Xrays showed no dynamic instability.Lumbardecompression was discussed to relieve symptoms.Therisks of surgery were explained to include hematoma, infection, damage to nerve roots, CSF leak, weakness, numbness, pain, need for future surgery including fusion, heart attack, and stroke. He elected to proceed with surgery for symptom relief.   Procedure The patient was brought to the OR after informed consent was obtained.He was given general anesthesia and intubated by the anesthesia service. Vascular access lines were placed.The patient was then placed prone on a Wilson frameensuring all pressure points were padded. A time-out was performed per protocol.   The patient was sterilely prepped and draped.Theplannedincision wasfound using fluoroscopy andmarked andinstilled withlocal anesthetic with epinephrine. The skin was opened sharply and the dissection taken to the fascia. This was incised and cautery was used to dissect the subperiosteal plane to expose the spinous processes and lamina of L3-4. Retractors were inserted and hemostasis was achieved. X-ray was used to confirm location.  Next, a matchstickdrill bit was used to  remove the inferior L3lamina and superior L4lamina to expose the ligament.The ligament was seen to be hypertrophied at this level.The underlying ligament was freed and removed with combination of rongeurs starting medially to lateral. The decompression was carried laterally where facet overgrowth and thick ligament was seen in the lateral recess. This was removed bilaterally.The dura was seen to expand as this was performed. The blunt tool was attempted to pass out thelateral recesses bilaterallyand found to be free as well.The bone edges were smooth and no obvious compression remained.  Once the dura appeared free from all the bone edges and all soft tissue compression was removed, hemostasis was obtained with Floseal and cautery. The wound was irrigated profusely. The muscle was then closed using 0 vicryl followed by the fascia with 0 vicryl. Next, multiple subcutaneous and dermal layers were closed with 2-0 vicryl until the epidermis was well approximated. The skin was closed withDermabond.   The patient was returned to supine position and extubated by the anesthesia service. The patient was then taken to the PACU for post-operative care where he was moving extremities symmetrically.   ESTIMATED BLOOD LOSS: 50cc  SPECIMENS None  IMPLANT None   I performed the case in its entiretywith the assistance of Marin Olp, [place],  Mark Riggs, Mark Riggs

## 2018-09-22 NOTE — Anesthesia Procedure Notes (Signed)
Procedure Name: Intubation Performed by: Fletcher-Harrison, Myron Stankovich, CRNA Pre-anesthesia Checklist: Patient identified, Emergency Drugs available, Suction available and Patient being monitored Patient Re-evaluated:Patient Re-evaluated prior to induction Oxygen Delivery Method: Circle system utilized Preoxygenation: Pre-oxygenation with 100% oxygen Induction Type: IV induction Ventilation: Mask ventilation without difficulty Laryngoscope Size: McGraph and 3 Grade View: Grade I Tube type: Oral Tube size: 7.0 mm Number of attempts: 1 Airway Equipment and Method: Stylet Placement Confirmation: ETT inserted through vocal cords under direct vision,  positive ETCO2,  CO2 detector and breath sounds checked- equal and bilateral Secured at: 21 cm Tube secured with: Tape Dental Injury: Teeth and Oropharynx as per pre-operative assessment        

## 2018-09-22 NOTE — Progress Notes (Signed)
Procedure: L3-4 laminectomy Procedure date: 09/22/2018 Diagnosis: Neurogenic claudication  History: Mark Riggs is s/p L3-4 laminectomy for neurogenic claudication POD0: Tolerated procedure well without complication.  Seen in postop recovery still disoriented from anesthesia but able to answer questions and obey commands.  Denies any lower extremity pain/numbness/tingling.  Physical Exam: Vitals:   09/22/18 1259 09/22/18 1303  BP: 121/77   Pulse: 93 91  Resp: 15 (!) 31  Temp:    SpO2: 100% 96%    Strength: Unable to fully assess at this time.  Able to move all extremities independently Sensation: Unable to fully assess at this time Skin: Dressing clean and dry at incision site  Data:  Recent Labs  Lab 09/16/18 0929  NA 136  K 3.8  CL 100  CO2 26  BUN 24*  CREATININE 1.10  GLUCOSE 116*  CALCIUM 9.0   No results for input(s): AST, ALT, ALKPHOS in the last 168 hours.  Invalid input(s): TBILI   Recent Labs  Lab 09/16/18 0929  WBC 8.7  HGB 14.9  HCT 43.1  PLT 148*   Recent Labs  Lab 09/16/18 0929  APTT 34  INR 1.0          Other tests/results: No imaging reviewed  Assessment/Plan:  Mark Riggs is POD 0 status post L3-4 laminectomy for neurogenic claudication.  Denies any complaints at this time.  We will continue to monitor.  -Catheter care (removal POD1) - mobilize - pain control - DVT prophylaxis - PTOT  Marin Olp PA-C Department of Neurosurgery

## 2018-09-22 NOTE — Interval H&P Note (Signed)
History and Physical Interval Note:  09/22/2018 9:52 AM  Bennie Pierini  has presented today for surgery, with the diagnosis of lumbar stenosis with claudication m48.062.  The various methods of treatment have been discussed with the patient and family. After consideration of risks, benefits and other options for treatment, the patient has consented to  Procedure(s): L3-4 LAMINECTOMY (N/A) as a surgical intervention.  The patient's history has been reviewed, patient examined, no change in status, stable for surgery.  I have reviewed the patient's chart and labs.  Questions were answered to the patient's satisfaction.     Deetta Perla

## 2018-09-22 NOTE — Anesthesia Post-op Follow-up Note (Signed)
Anesthesia QCDR form completed.        

## 2018-09-22 NOTE — H&P (Signed)
Mark Riggs is an 78 y.o. male.   Chief Complaint: Neurogenic Claudication HPI: Mark Riggs is here for evaluation of bilateral buttocks pain that he states he gets when he is up moving around and will improve with sitting or lying down. He was seen last November but felt that his symptoms started to improve after that. He feels that this has worsened over the past few weeks. He does have a recent diagnosis of Parkinson's disease and has been started on Sinemet. For the pain in his buttocks area he has been taking aspirin and Tylenol with some intermittent ibuprofen. He is concerned about different medication side effects and combined with his other Parkinson's medications. He feels he is unable to walk any significant distance without the symptoms onset. He denies any pain or numbness going down the calf his legs. He notes no obvious weakness in his legs but is bothered by the symptoms.MRI showed stenosis at L3/4 and we discussed laminectomy for decompression. He is ready to proceed   Past Medical History:  Diagnosis Date  . BPH (benign prostatic hyperplasia)   . Hyperlipemia   . Hypertension   . Pre-diabetes   . Renal cancer ([place]) 2007   left  . Renal mass     Past Surgical History:  Procedure Laterality Date  . APPENDECTOMY    . RENAL MASS EXCISION  2008  . TONSILLECTOMY    . TRANSURETHRAL RESECTION OF PROSTATE     patient unsure about this surgery long time ago by Dr. Ernst Riggs    History reviewed. No pertinent family history. Social History:  reports that he has quit smoking. He has never used smokeless tobacco. He reports that he does not drink alcohol or use drugs.  Allergies:  Allergies  Allergen Reactions  . Contrast Media [Iodinated Diagnostic Agents] Swelling  . Sulfa Antibiotics Swelling  . Tetracyclines & Related Other (See Comments)    unknown    Medications Prior to Admission  Medication Sig Dispense Refill  . ALPRAZolam (XANAX) 1 MG tablet Take 1 mg by mouth 3  (three) times daily as needed for anxiety.    Marland Kitchen aspirin 81 MG tablet Take 81 mg by mouth daily.    . carbidopa-levodopa (SINEMET IR) 25-100 MG tablet Take 2 tablets by mouth 3 (three) times daily.     . cholecalciferol (VITAMIN D3) 25 MCG (1000 UT) tablet Take 1,000 Units by mouth daily.    Marland Kitchen glimepiride (AMARYL) 2 MG tablet Take 2 mg by mouth daily with breakfast.    . lisinopril (ZESTRIL) 2.5 MG tablet Take 2.5 mg by mouth daily.     . simvastatin (ZOCOR) 40 MG tablet Take 40 mg by mouth daily.    . vitamin B-12 (CYANOCOBALAMIN) 1000 MCG tablet Take 1,000 mcg by mouth daily.    . clotrimazole (LOTRIMIN) 1 % cream Apply to affected area 2 times daily for 10 days (Patient not taking: Reported on 09/09/2018) 15 g 0  . hydrocortisone 2.5 % lotion Apply twice daily for 1 week, then once daily for 1 week. (Patient not taking: Reported on 09/09/2018) 59 mL 0  . methocarbamol (ROBAXIN) 500 MG tablet Take 1 tablet (500 mg total) by mouth every 6 (six) hours as needed for muscle spasms. (Patient not taking: Reported on 09/09/2018) 20 tablet 0    Results for orders placed or performed during the hospital encounter of 09/22/18 (from the past 48 hour(s))  Glucose, capillary     Status: Abnormal   Collection Time: 09/22/18  8:59 AM  Result Value Ref Range   Glucose-Capillary 135 (H) 70 - 99 mg/dL  ABO/Rh     Status: None (Preliminary result)   Collection Time: 09/22/18  9:17 AM  Result Value Ref Range   ABO/RH(D) PENDING    No results found.  ROS General ROS: Negative Respiratory ROS: Negative Cardiovascular ROS: Negative Gastrointestinal ROS: Negative Genito-Urinary ROS: Negative Musculoskeletal ROS: Positive for mild back pain Neurological ROS: Positive for bilateral buttocks pain, negative for numbness Dermatological ROS: Negative  Blood pressure 126/78, pulse 65, temperature 98.4 F (36.9 C), temperature source Tympanic, resp. rate 16, height 5\' 8"  (1.727 m), weight 85 kg, SpO2 100 %. Physical  Exam  General appearance: Alert, cooperative, in no acute distress Back: No tenderness to palpation of the midline or paramedian regions CV: regular rate Pulm: Clear to auscultation  Neurologic exam:  Mental status: alertness: alert, affect: normal Speech: fluent and clear Motor:strength symmetric 5/5 in bilateral lower extremities Sensory: intact to light touch in bilateral lower extremities Gait: Slow shuffled gait   Assessment/Plan Ready to proceed with L3/4 laminectomy  Deetta Perla, MD 09/22/2018, 9:49 AM

## 2018-09-22 NOTE — Anesthesia Postprocedure Evaluation (Signed)
Anesthesia Post Note  Patient: Mark Riggs  Procedure(s) Performed: L3-4 LAMINECTOMY (N/A )  Patient location during evaluation: PACU Anesthesia Type: General Level of consciousness: awake and alert and oriented Pain management: pain level controlled Vital Signs Assessment: post-procedure vital signs reviewed and stable Respiratory status: spontaneous breathing, nonlabored ventilation and respiratory function stable Cardiovascular status: blood pressure returned to baseline and stable Postop Assessment: no signs of nausea or vomiting Anesthetic complications: no     Last Vitals:  Vitals:   09/22/18 1334 09/22/18 1439  BP:  130/73  Pulse: 86 79  Resp: 11 18  Temp: 36.7 C   SpO2: 99% 97%    Last Pain:  Vitals:   09/22/18 1439  TempSrc:   PainSc: 2                  Kahlen Morais

## 2018-09-23 DIAGNOSIS — M48062 Spinal stenosis, lumbar region with neurogenic claudication: Secondary | ICD-10-CM | POA: Diagnosis not present

## 2018-09-23 MED ORDER — TRAMADOL HCL 50 MG PO TABS: 50.0000 mg | ORAL_TABLET | ORAL | 0 refills | Status: AC | PRN

## 2018-09-23 MED ORDER — METHOCARBAMOL 500 MG PO TABS: 500.0000 mg | ORAL_TABLET | Freq: Four times a day (QID) | ORAL | 0 refills | Status: DC | PRN

## 2018-09-23 NOTE — Progress Notes (Signed)
OT Cancellation Note  Patient Details Name: Mark Riggs MRN: [id number] DOB: 1940-03-16   Cancelled Treatment:    Reason Eval/Treat Not Completed: Other (comment). Order received, chart reviewed. Pt working with PT. Will re-attempt OT evaluation at later time as pt is available.  Jeni Salles, MPH, MS, OTR/L ascom [phone] 09/23/18, 9:22 AM

## 2018-09-23 NOTE — Evaluation (Signed)
Physical Therapy Evaluation Patient Details Name: Mark Riggs MRN: [id number] DOB: 1940-07-05 Today's Date: 09/23/2018   History of Present Illness  78yo male s/p L3-L4 lumbar laminectomy now with lumbar back precautions. PMHx includes recent Parkinson's Disease diagnosis, HTN, DM, HLD, and renal cancer.  Clinical Impression  Pt did well with PT exam, has no sensory or strength asymmetries or issues and generally is feeling better now than pre-surgery.  He reports some "swimmy" headedness (meds) and did not want to take pain meds before PT exam; he was not pain limited and generally did well with PT activities.  He showed ability to ambulate in the home with and w/o AD, though he did have a stagger step and recommended that he use an AD for at least a while when he does home.  He was able to maintain neutral spine w/o mobility/transfers and overall did well.     Follow Up Recommendations Follow surgeon's recommendation for DC plan and follow-up therapies    Equipment Recommendations  3in1 (PT)    Recommendations for Other Services       Precautions / Restrictions Precautions Precautions: Back;Fall Precaution Booklet Issued: Yes (comment) Precaution Comments: no bending, lifting >10lb, twisting, or arching Restrictions Weight Bearing Restrictions: No      Mobility  Bed Mobility Overal bed mobility: Independent             General bed mobility comments: head of bed flat, pt able to log roll and maintain neutral spine with minimal use of rail  Transfers Overall transfer level: Modified independent Equipment used: Rolling walker (2 wheeled) Transfers: Sit to/from Stand Sit to Stand: Supervision         General transfer comment: Cues for appropriate strategy, does need significant UE use to attain standing, no direct assist  Ambulation/Gait Ambulation/Gait assistance: Supervision Gait Distance (Feet): 250 Feet Assistive device: Rolling walker (2 wheeled)        General Gait Details: Pt was able to ambulate ~175 ft w/ AD and ~75 ft w/o AD.  He showed good overall confidence; did have one small LOBs while not using RW.  Pt with consistent speed and despite small stagger step was safe and did not need any direct assist  Stairs            Wheelchair Mobility    Modified Rankin (Stroke Patients Only)       Balance Overall balance assessment: Modified Independent                                           Pertinent Vitals/Pain Pain Assessment: 0-10 Pain Score: 2  Pain Location: surgical site Pain Descriptors / Indicators: Aching Pain Intervention(s): Limited activity within patient's tolerance;Monitored during session    Home Living Family/patient expects to be discharged to:: Private residence Living Arrangements: Spouse/significant other Available Help at Discharge: Family;Available 24 hours/day Type of Home: House Home Access: Stairs to enter Entrance Stairs-Rails: None Entrance Stairs-Number of Steps: 1 Home Layout: One level Home Equipment: Walker - 2 wheels      Prior Function Level of Independence: Independent         Comments: Ambulating without AD.  No falls in the past 6 months.  Ind with ADLs.      Hand Dominance        Extremity/Trunk Assessment   Upper Extremity Assessment Upper Extremity Assessment: Overall WFL for tasks  assessed    Lower Extremity Assessment Lower Extremity Assessment: Overall WFL for tasks assessed    Cervical / Trunk Assessment Cervical / Trunk Assessment: Other exceptions Cervical / Trunk Exceptions: s/p lumbar laminectomy  Communication   Communication: No difficulties  Cognition Arousal/Alertness: Awake/alert Behavior During Therapy: WFL for tasks assessed/performed Overall Cognitive Status: Within Functional Limits for tasks assessed                                        General Comments      Exercises Other Exercises Other  Exercises: Pt/spouse instructed in back precautions, compression stocking mgt, falls prevention strategies, AE/DME, home/routines modifications with handout provided   Assessment/Plan    PT Assessment Patient needs continued PT services  PT Problem List Pain;Decreased activity tolerance;Decreased safety awareness;Decreased knowledge of use of DME;Decreased balance       PT Treatment Interventions DME instruction;Gait training;Stair training;Functional mobility training;Therapeutic activities;Therapeutic exercise;Balance training;Neuromuscular re-education;Patient/family education    PT Goals (Current goals can be found in the Care Plan section)  Acute Rehab PT Goals Patient Stated Goal: to go home PT Goal Formulation: With patient Time For Goal Achievement: 10/07/18 Potential to Achieve Goals: Good    Frequency 7X/week   Barriers to discharge        Co-evaluation               AM-PAC PT "6 Clicks" Mobility  Outcome Measure Help needed turning from your back to your side while in a flat bed without using bedrails?: None Help needed moving from lying on your back to sitting on the side of a flat bed without using bedrails?: None Help needed moving to and from a bed to a chair (including a wheelchair)?: None Help needed standing up from a chair using your arms (e.g., wheelchair or bedside chair)?: None Help needed to walk in hospital room?: None Help needed climbing 3-5 steps with a railing? : None 6 Click Score: 24    End of Session Equipment Utilized During Treatment: Gait belt Activity Tolerance: Patient tolerated treatment well Patient left: with chair alarm set;with call bell/phone within reach;with family/visitor present Nurse Communication: Mobility status PT Visit Diagnosis: Muscle weakness (generalized) (M62.81);Difficulty in walking, not elsewhere classified (R26.2);Pain Pain - part of body: (lumbago)    Time: [id number] PT Time Calculation (min) (ACUTE  ONLY): 35 min   Charges:   PT Evaluation $PT Eval Low Complexity: 1 Low PT Treatments $Therapeutic Activity: 8-22 mins        Kreg Shropshire, DPT 09/23/2018, 11:35 AM

## 2018-09-23 NOTE — Discharge Summary (Signed)
Procedure: L3-4 laminectomy Procedure date: 09/22/2018 Diagnosis: Neurogenic claudication  History: Mark Riggs is s/p L3-4 laminectomy for neurogenic claudication  POD1: Recovering well.  Denies any lower extremity symptoms such as pain/numbness/tingling/weakness.  This morning he does state that he feels a little more wobbly than normal when he attempted to stand.  Pain currently rated 1/10. Eaten and voided without issue. Through the morning, patient was able to ambulate to bathroom, carrying a walker.  Also received physical therapy without any recommendations of home health services. Bedside commode requested.  POD0: Tolerated procedure well without complication.  Seen in postop recovery still disoriented from anesthesia but able to answer questions and obey commands.  Denies any lower extremity pain/numbness/tingling.  Physical Exam: Vitals:   09/23/18 0414 09/23/18 0802  BP: (!) 157/86 140/78  Pulse: 78 77  Resp: 18   Temp: 97.6 F (36.4 C) 97.8 F (36.6 C)  SpO2: 94% 97%    Strength: 5/5 throughout lower extremities  sensation: Intact and symmetric throughout lower extremities Skin: Dressing clean and dry at incision site  Data:  Recent Labs  Lab 09/16/18 0929  NA 136  K 3.8  CL 100  CO2 26  BUN 24*  CREATININE 1.10  GLUCOSE 116*  CALCIUM 9.0   No results for input(s): AST, ALT, ALKPHOS in the last 168 hours.  Invalid input(s): TBILI   Recent Labs  Lab 09/16/18 0929  WBC 8.7  HGB 14.9  HCT 43.1  PLT 148*   Recent Labs  Lab 09/16/18 0929  APTT 34  INR 1.0          Other tests/results: No imaging reviewed  Assessment/Plan:  Mark Riggs is POD 1 status post L3-4 laminectomy for neurogenic claudication.  He is recovering well.  Pain is remained between 1-4/10.  Ambulated with physical therapy, voided, and eating without issue.  We will continue postop pain control with tramadol (patient request), muscle relaxer, and Tylenol.  Patient may  start ibuprofen after 3 days.  Advised that medications are only as needed.  No home health services recommended.  He is scheduled to follow-up in approximately 2 weeks to monitor progress.  Marin Olp PA-C Department of Neurosurgery

## 2018-09-23 NOTE — TOC Progression Note (Signed)
Transition of Care [facility]) - Progression Note    Patient Details  Name: Mark Riggs MRN: [id number] Date of Birth: 06-Jul-1940  [facility] CM/SW Contact  Su Hilt, RN Phone Number: 09/23/2018, 9:37 AM  Clinical Narrative:    Jeannine Boga PA and let her know per the conversation with PT the patient will not need HH, he did really well,  He will need a BSC.  The patient has a RW at home and does not need other DME equipment       Expected Discharge Plan and Services                                                 Social Determinants of Health (SDOH) Interventions    Readmission Risk Interventions No flowsheet data found.

## 2018-09-23 NOTE — Discharge Instructions (Signed)

## 2018-09-23 NOTE — TOC Transition Note (Signed)
Transition of Care [facility]) - CM/SW Discharge Note   Patient Details  Name: Mark Riggs MRN: [id number] Date of Birth: 24-Apr-1940  Transition of Care [facility]) CM/SW Contact:  Su Hilt, RN Phone Number: 09/23/2018, 11:50 AM   Clinical Narrative:    The patient is discharging home with his wofe and has no HH needs, he was provided a BSC by [employer] to take home.  He has his follow up appointments already.  No additional needs   Final next level of care: OP Rehab Barriers to Discharge: Barriers Resolved   Patient Goals and CMS Choice        Discharge Placement                       Discharge Plan and Services                                     Social Determinants of Health (SDOH) Interventions     Readmission Risk Interventions No flowsheet data found.

## 2018-09-23 NOTE — Evaluation (Signed)
Occupational Therapy Evaluation Patient Details Name: Mark Riggs MRN: [id number] DOB: 05/19/1940 Today's Date: 09/23/2018    History of Present Illness 78yo male s/p L3-L4 lumbar laminectomy now with lumbar back precautions. PMHx includes recent Parkinson's Disease diagnosis, HTN, DM, HLD, and renal cancer.   Clinical Impression   Pt seen for OT evaluation this date, POD#1 from lumbar fusion. Prior to hospital admission, pt was independent with mobility, ADL, and IADL. Pt lives with spouse in a single family home with 1 step to enter and no handrails with spouse able to provide 24/7 assist/support as needed for pt. Currently pt is at supervision level with all aspects of mobility and min assist for LB ADL tasks. Pt/spouse educated in back precautions with handout provided, self care skills, bed mobility and functional transfer training, AE/DME for bathing, dressing, and toileting needs, and home/routines modifications and falls prevention strategies to maximize safety and functional independence while minimizing falls risk and maintaining precautions. Pt would benefit from a handheld shower head for seated showers and a BSC to support improved ADL performance and safety while maintaining back precautions. Pt/spouse verbalized understanding of all education/training provided. Handout provided to support recall and carry over of learned precautions/techniques for bed mobility, functional transfers, and self care skills. Do not anticipate need for skilled OT services upon discharge.     Follow Up Recommendations  No OT follow up    Equipment Recommendations  3 in 1 bedside commode;Other (comment)(reacher, HH shower head)    Recommendations for Other Services       Precautions / Restrictions Precautions Precautions: Back;Fall Precaution Booklet Issued: Yes (comment) Precaution Comments: no bending, lifting >10lb, twisting, or arching Restrictions Weight Bearing Restrictions: No       Mobility Bed Mobility               General bed mobility comments: deferred, received in recliner  Transfers Overall transfer level: Needs assistance   Transfers: Sit to/from Stand Sit to Stand: Supervision              Balance Overall balance assessment: Mild deficits observed, not formally tested                                         ADL either performed or assessed with clinical judgement   ADL Overall ADL's : Modified independent                                       General ADL Comments: Caregiver able to provide needed level of assist for LB ADL Tasks     Vision Patient Visual Report: No change from baseline       Perception     Praxis      Pertinent Vitals/Pain Pain Assessment: 0-10 Pain Score: 2  Pain Location: surgical site Pain Descriptors / Indicators: Aching Pain Intervention(s): Limited activity within patient's tolerance;Monitored during session     Hand Dominance     Extremity/Trunk Assessment Upper Extremity Assessment Upper Extremity Assessment: Overall WFL for tasks assessed   Lower Extremity Assessment Lower Extremity Assessment: Overall WFL for tasks assessed   Cervical / Trunk Assessment Cervical / Trunk Assessment: Other exceptions Cervical / Trunk Exceptions: s/p lumbar laminectomy   Communication Communication Communication: No difficulties   Cognition Arousal/Alertness: Awake/alert Behavior During Therapy: [facility] for  tasks assessed/performed Overall Cognitive Status: Within Functional Limits for tasks assessed                                     General Comments       Exercises Other Exercises Other Exercises: Pt/spouse instructed in back precautions, compression stocking mgt, falls prevention strategies, AE/DME, home/routines modifications with handout provided   Shoulder Instructions      Home Living Family/patient expects to be discharged to:: Private  residence Living Arrangements: Spouse/significant other Available Help at Discharge: Family;Available 24 hours/day Type of Home: House Home Access: Stairs to enter [employer] of Steps: 1   Home Layout: One level     Bathroom Shower/Tub: Occupational psychologist: Standard     Home Equipment: Environmental consultant - 2 wheels          Prior Functioning/Environment Level of Independence: Independent                 OT Problem List: Pain;Decreased knowledge of precautions      OT Treatment/Interventions: Self-care/ADL training;Therapeutic exercise;Therapeutic activities;DME and/or AE instruction;Patient/family education    OT Goals(Current goals can be found in the care plan section) Acute Rehab OT Goals Patient Stated Goal: to go home OT Goal Formulation: With patient/family Time For Goal Achievement: 10/07/18 Potential to Achieve Goals: Good ADL Goals Pt Will Perform Lower Body Dressing: with caregiver independent in assisting;sit to/from stand(maintaining back precautions) Additional ADL Goal #1: Pt will independently instruct family/caregiver in compression stocking mgt Additional ADL Goal #2: Pt will verbalize 100% of back precautions and how to implement during bathing, dressing, and toileting.  OT Frequency: Min 1X/week   Barriers to D/C:            Co-evaluation              AM-PAC OT "6 Clicks" Daily Activity     Outcome Measure Help from another person eating meals?: None Help from another person taking care of personal grooming?: None Help from another person toileting, which includes using toliet, bedpan, or urinal?: A Little Help from another person bathing (including washing, rinsing, drying)?: A Little Help from another person to put on and taking off regular upper body clothing?: None Help from another person to put on and taking off regular lower body clothing?: A Little 6 Click Score: 21   End of Session    Activity Tolerance:  Patient tolerated treatment well Patient left: in chair;with call bell/phone within reach;with chair alarm set;with family/visitor present  OT Visit Diagnosis: Other abnormalities of gait and mobility (R26.89);Pain Pain - Right/Left: (lumbar spine)                Time: [id number] OT Time Calculation (min): 16 min Charges:  OT General Charges $OT Visit: 1 Visit OT Evaluation $OT Eval Low Complexity: 1 Low OT Treatments $Self Care/Home Management : 8-22 mins  Jeni Salles, MPH, MS, OTR/L ascom [phone] 09/23/18, 11:04 AM

## 2018-09-23 NOTE — Progress Notes (Signed)
Discharge summary reviewed with verbal understanding. Answered all questions.  

## 2018-09-24 ENCOUNTER — Encounter: Payer: Self-pay | Admitting: Neurosurgery

## 2018-10-31 DIAGNOSIS — E785 Hyperlipidemia, unspecified: Secondary | ICD-10-CM | POA: Diagnosis not present

## 2018-10-31 DIAGNOSIS — E119 Type 2 diabetes mellitus without complications: Secondary | ICD-10-CM | POA: Diagnosis not present

## 2018-11-03 DIAGNOSIS — G20 Parkinson's disease: Secondary | ICD-10-CM | POA: Diagnosis not present

## 2018-11-06 DIAGNOSIS — L578 Other skin changes due to chronic exposure to nonionizing radiation: Secondary | ICD-10-CM | POA: Diagnosis not present

## 2018-11-06 DIAGNOSIS — Z872 Personal history of diseases of the skin and subcutaneous tissue: Secondary | ICD-10-CM | POA: Diagnosis not present

## 2018-11-06 DIAGNOSIS — L720 Epidermal cyst: Secondary | ICD-10-CM | POA: Diagnosis not present

## 2018-11-06 DIAGNOSIS — L570 Actinic keratosis: Secondary | ICD-10-CM | POA: Diagnosis not present

## 2018-11-07 DIAGNOSIS — E119 Type 2 diabetes mellitus without complications: Secondary | ICD-10-CM | POA: Diagnosis not present

## 2018-11-07 DIAGNOSIS — E785 Hyperlipidemia, unspecified: Secondary | ICD-10-CM | POA: Diagnosis not present

## 2018-11-07 DIAGNOSIS — F419 Anxiety disorder, unspecified: Secondary | ICD-10-CM | POA: Diagnosis not present

## 2018-11-07 DIAGNOSIS — G20 Parkinson's disease: Secondary | ICD-10-CM | POA: Diagnosis not present

## 2019-03-11 DIAGNOSIS — G20 Parkinson's disease: Secondary | ICD-10-CM | POA: Diagnosis not present

## 2019-05-11 DIAGNOSIS — E119 Type 2 diabetes mellitus without complications: Secondary | ICD-10-CM | POA: Diagnosis not present

## 2019-05-11 DIAGNOSIS — I10 Essential (primary) hypertension: Secondary | ICD-10-CM | POA: Diagnosis not present

## 2019-05-14 DIAGNOSIS — E119 Type 2 diabetes mellitus without complications: Secondary | ICD-10-CM | POA: Diagnosis not present

## 2019-05-14 DIAGNOSIS — E785 Hyperlipidemia, unspecified: Secondary | ICD-10-CM | POA: Diagnosis not present

## 2019-05-14 DIAGNOSIS — Z23 Encounter for immunization: Secondary | ICD-10-CM | POA: Diagnosis not present

## 2019-07-08 DIAGNOSIS — G20 Parkinson's disease: Secondary | ICD-10-CM | POA: Diagnosis not present

## 2019-10-22 IMAGING — RF DG LUMBAR SPINE 2-3V
1 series · 3 of 3 positions shown · non-contrast
Comparison: Lumbar MRI 09/11/2018

CLINICAL DATA: L3-4 laminectomy.

EXAM:
DG C-ARM 1-60 MIN; LUMBAR SPINE - 2-3 VIEW
FLUOROSCOPY TIME:  Fluoroscopy Time:  7.4 seconds
Radiation Exposure Index (if provided by the fluoroscopic device):
NA
Number of Acquired Spot Images: 3

[Series 1: unknown protocol · 0.14mm/px · 3 of 3 slices shown]
[im 1/3]
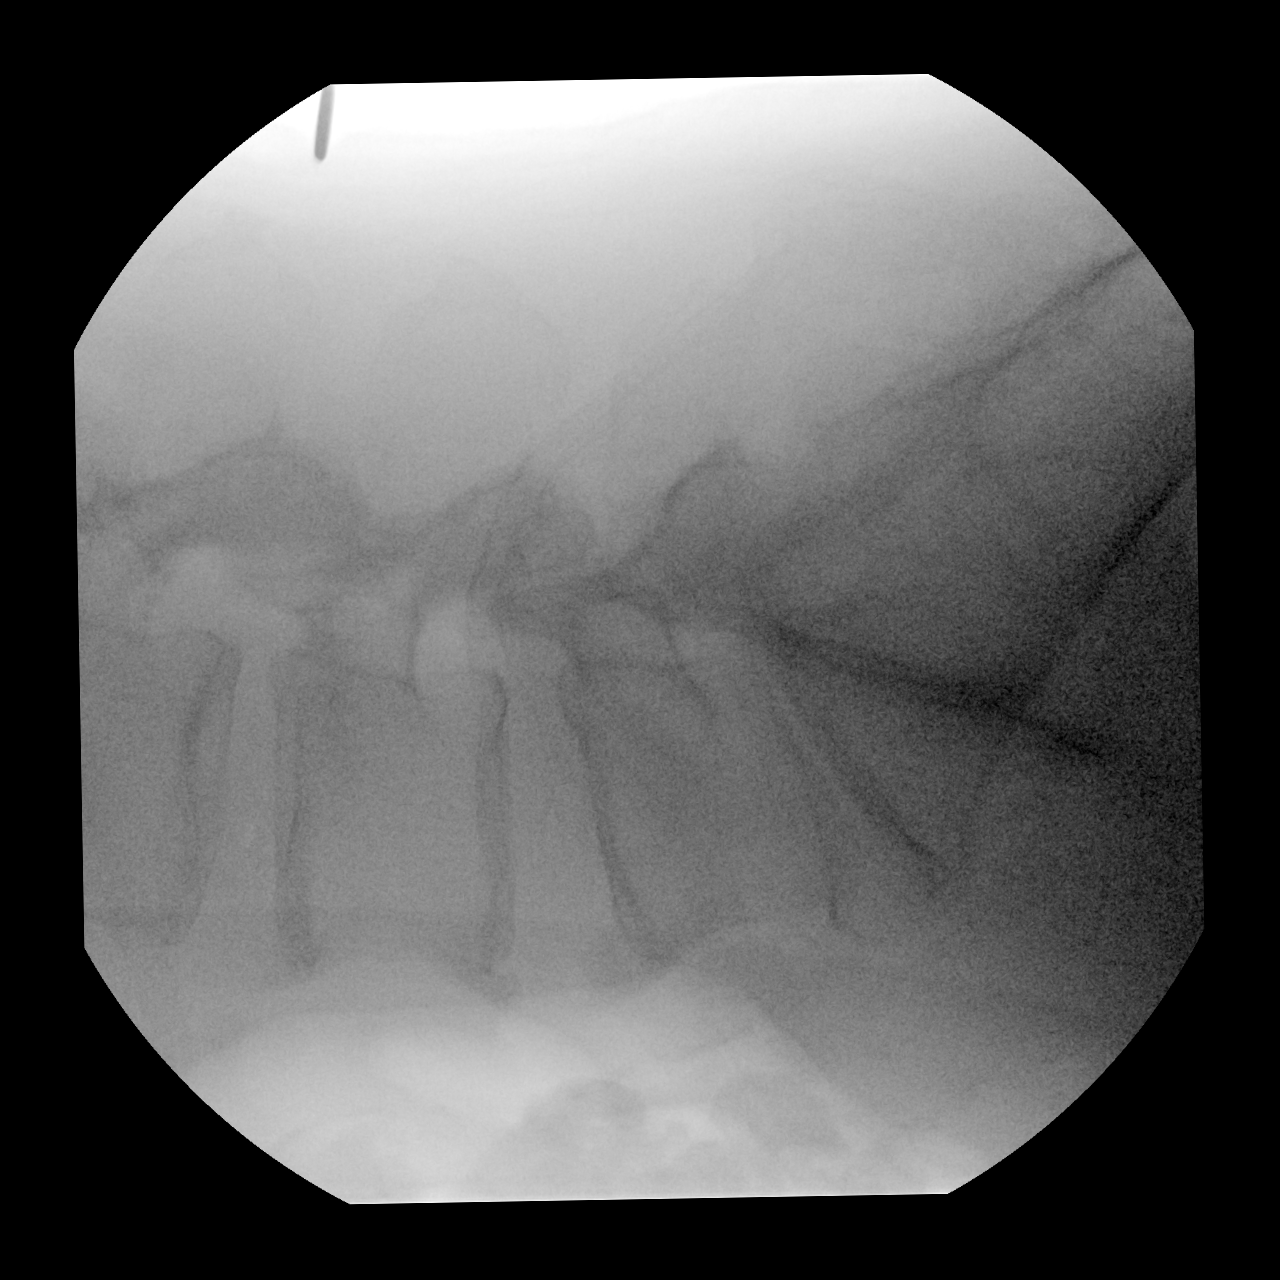
[im 2/3]
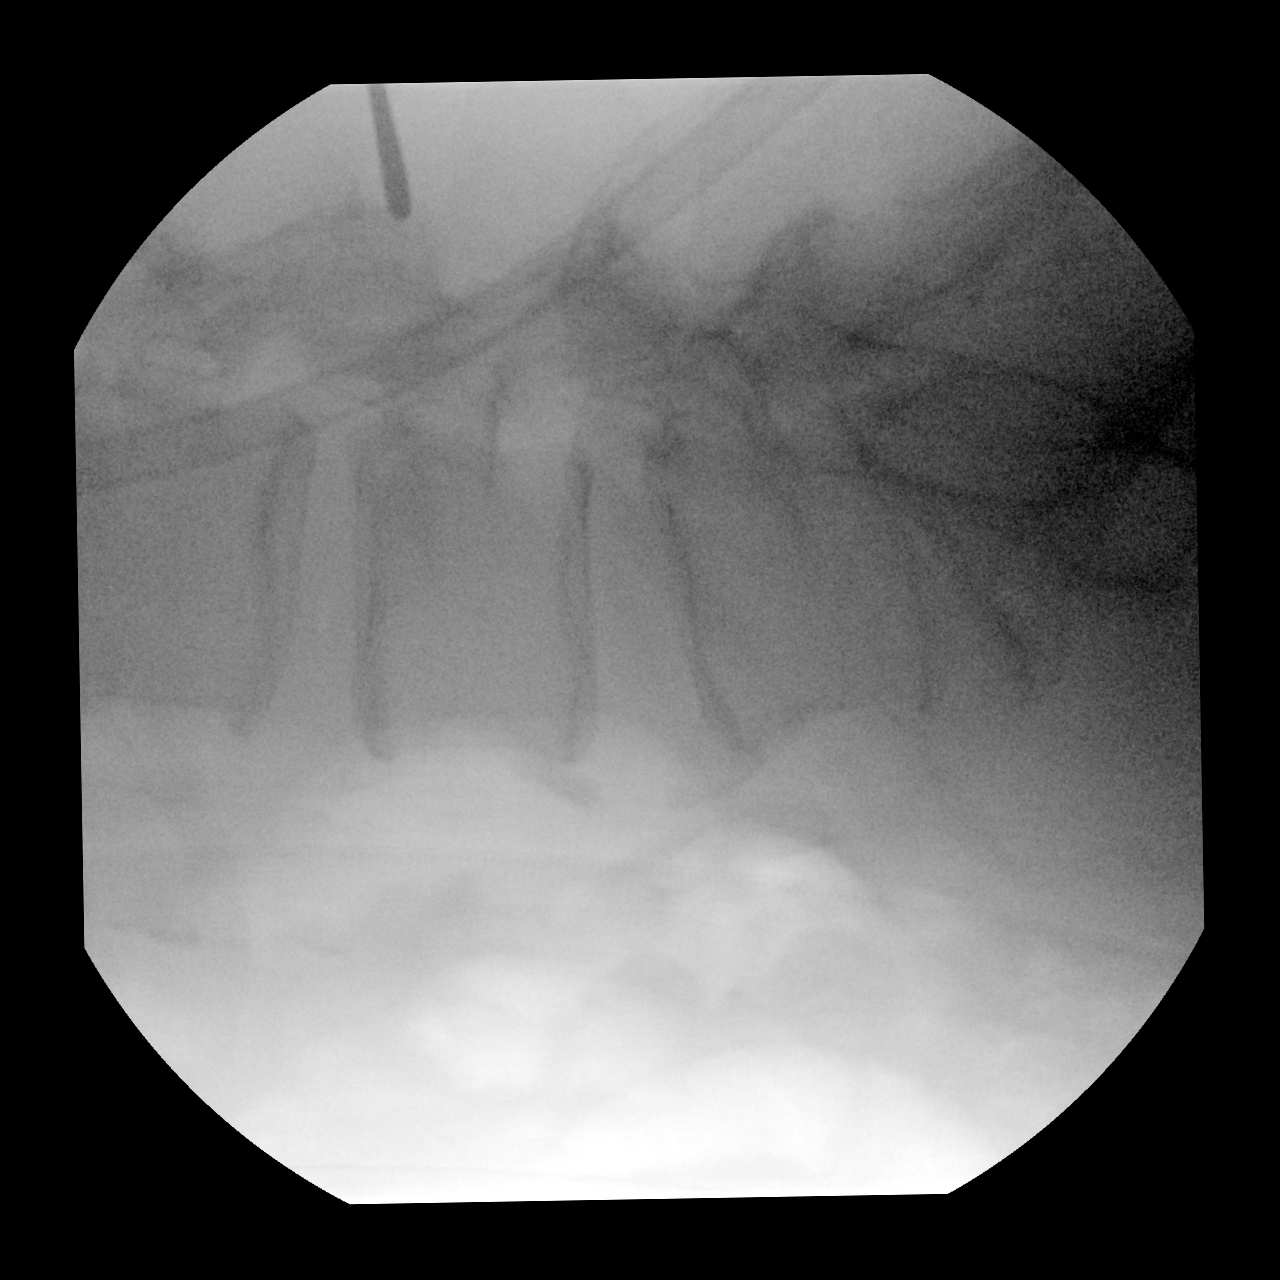
[im 3/3]
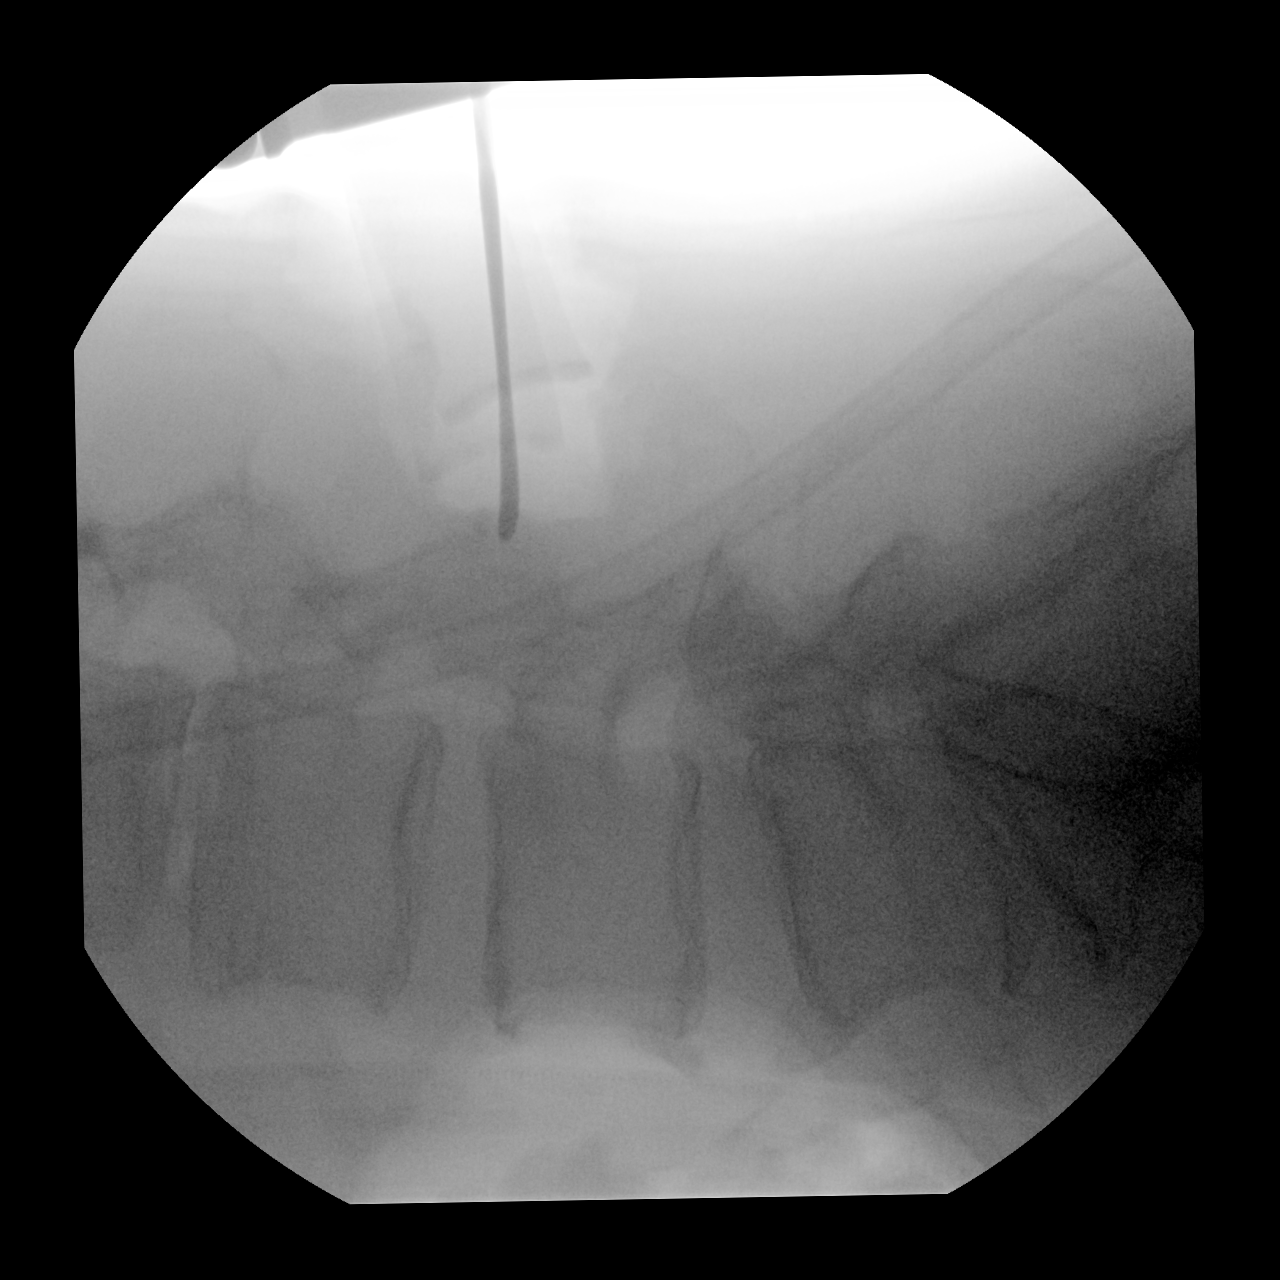

[3 of 3 positions shown; findings below may reference images not displayed]

FINDINGS: Three lateral spot fluoroscopic images of the lumbar spine are
submitted from the operating room. On image number 1, there is a
posterior localizing needle posterior to the L3 spinous process. On
image number 2, there is an instrument directed towards the L3-4
posterior elements. The 3rd and final image demonstrates a surgical
instrument posterior to the L3-4 facet joints.
IMPRESSION: Intraoperative localization as described.

## 2019-10-22 IMAGING — RF DG C-ARM 1-60 MIN
1 series · 3 of 3 positions shown · non-contrast
Comparison: Lumbar MRI 09/11/2018

CLINICAL DATA: L3-4 laminectomy.

EXAM:
DG C-ARM 1-60 MIN; LUMBAR SPINE - 2-3 VIEW
FLUOROSCOPY TIME:  Fluoroscopy Time:  7.4 seconds
Radiation Exposure Index (if provided by the fluoroscopic device):
NA
Number of Acquired Spot Images: 3

[Series 1: unknown protocol · 0.14mm/px · 3 of 3 slices shown]
[im 1/3]
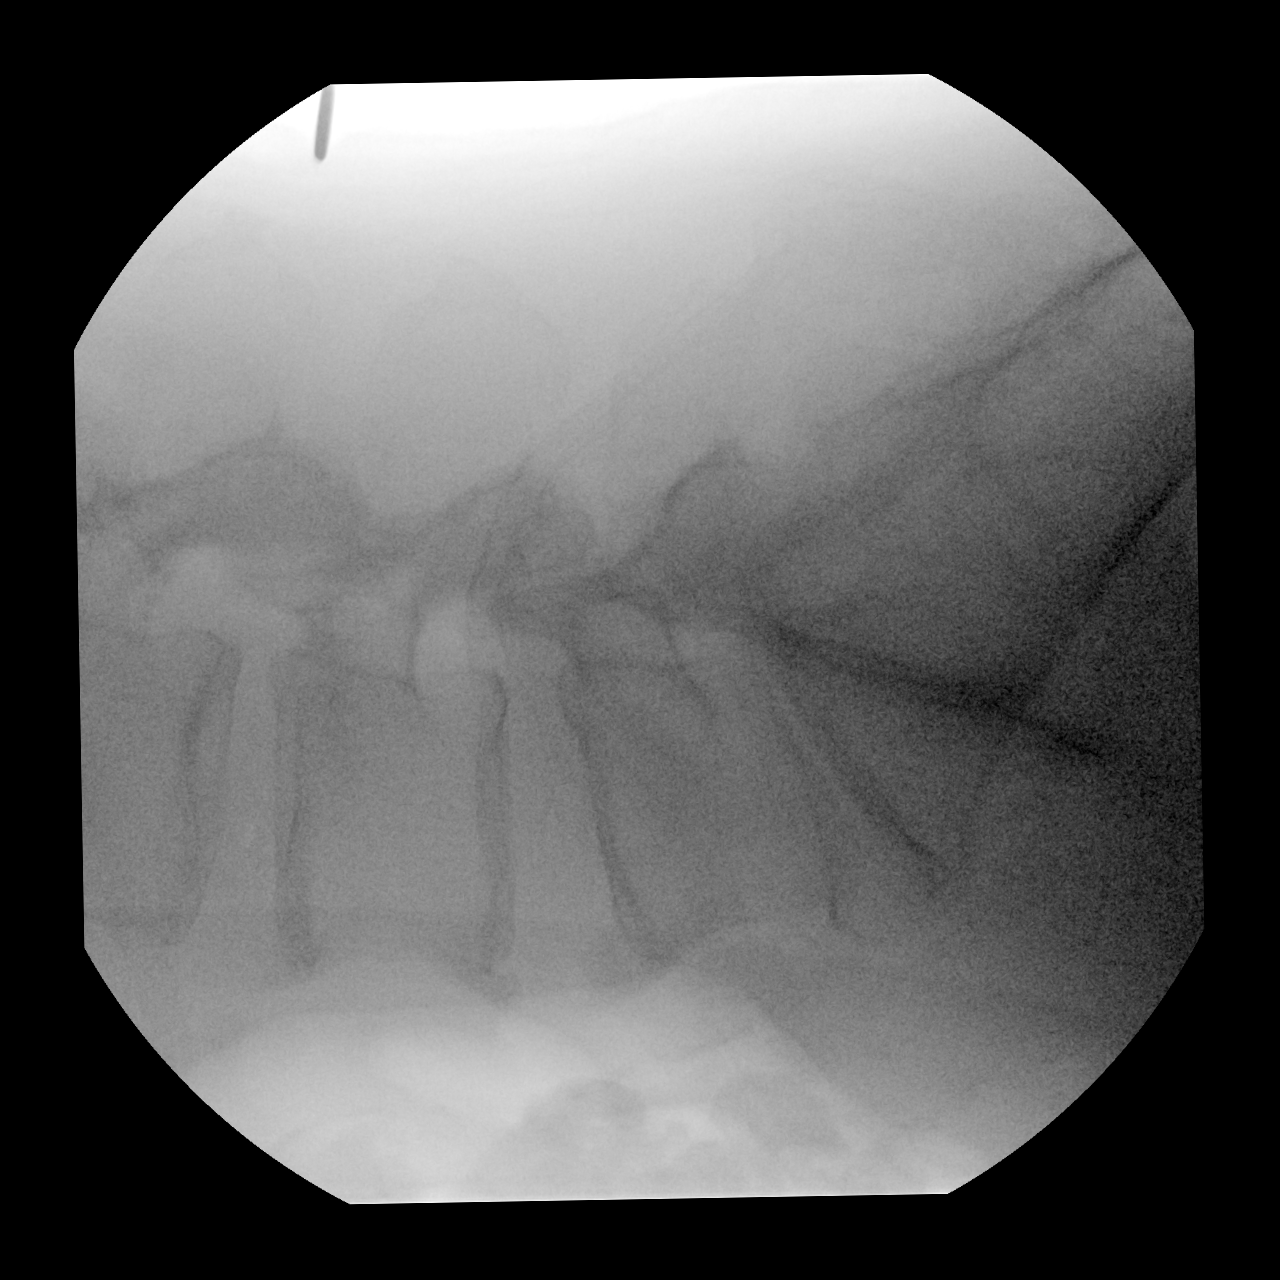
[im 2/3]
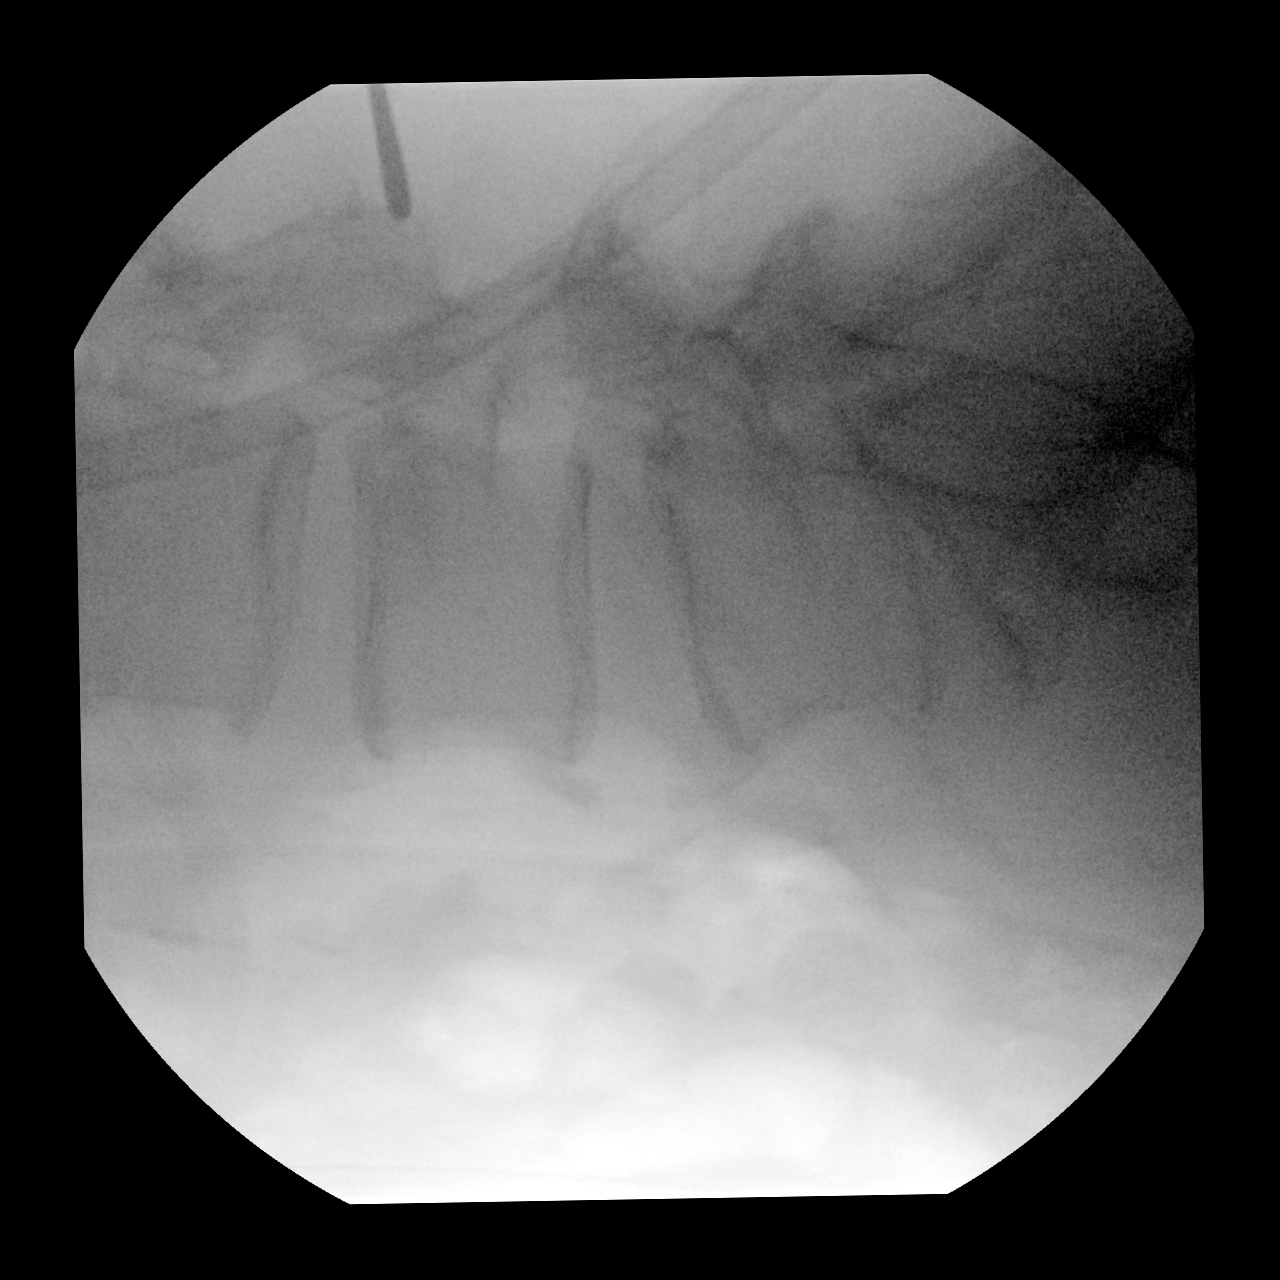
[im 3/3]
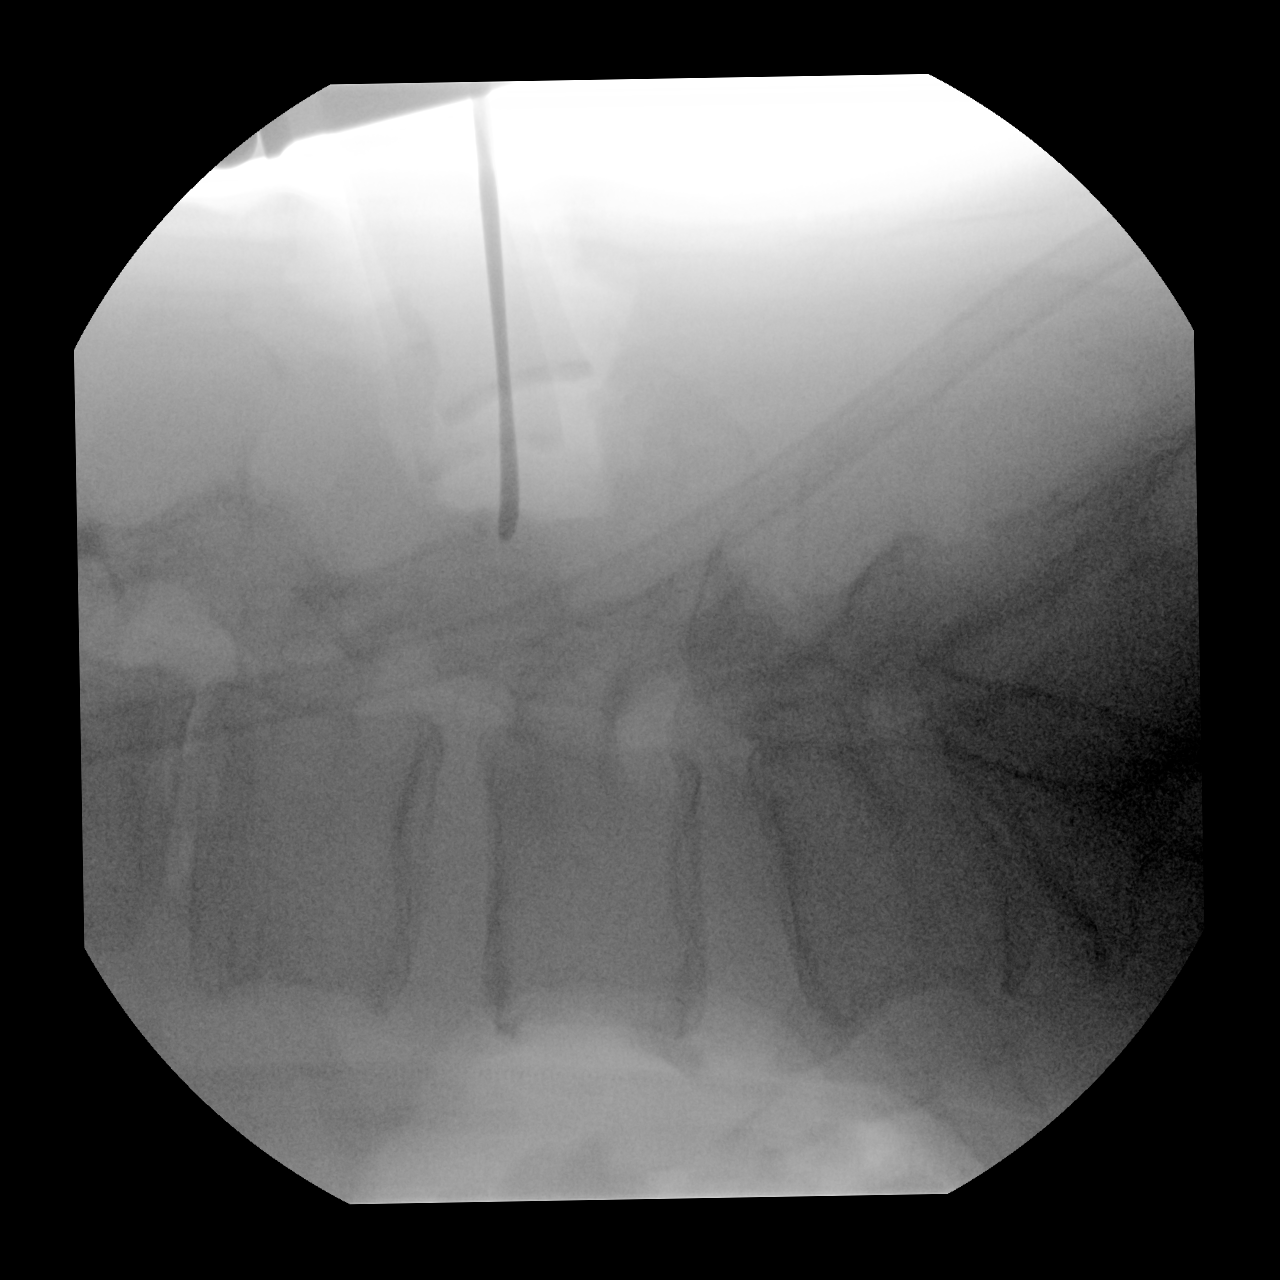

[3 of 3 positions shown; findings below may reference images not displayed]

FINDINGS: Three lateral spot fluoroscopic images of the lumbar spine are
submitted from the operating room. On image number 1, there is a
posterior localizing needle posterior to the L3 spinous process. On
image number 2, there is an instrument directed towards the L3-4
posterior elements. The 3rd and final image demonstrates a surgical
instrument posterior to the L3-4 facet joints.
IMPRESSION: Intraoperative localization as described.

## 2019-11-13 DIAGNOSIS — E119 Type 2 diabetes mellitus without complications: Secondary | ICD-10-CM | POA: Diagnosis not present

## 2019-11-13 DIAGNOSIS — E785 Hyperlipidemia, unspecified: Secondary | ICD-10-CM | POA: Diagnosis not present

## 2019-11-17 DIAGNOSIS — E785 Hyperlipidemia, unspecified: Secondary | ICD-10-CM | POA: Diagnosis not present

## 2019-11-17 DIAGNOSIS — E119 Type 2 diabetes mellitus without complications: Secondary | ICD-10-CM | POA: Diagnosis not present

## 2019-11-17 DIAGNOSIS — F419 Anxiety disorder, unspecified: Secondary | ICD-10-CM | POA: Diagnosis not present

## 2019-12-24 DIAGNOSIS — G20 Parkinson's disease: Secondary | ICD-10-CM | POA: Diagnosis not present

## 2020-02-11 DIAGNOSIS — L720 Epidermal cyst: Secondary | ICD-10-CM | POA: Diagnosis not present

## 2020-02-11 DIAGNOSIS — L821 Other seborrheic keratosis: Secondary | ICD-10-CM | POA: Diagnosis not present

## 2020-02-11 DIAGNOSIS — L570 Actinic keratosis: Secondary | ICD-10-CM | POA: Diagnosis not present

## 2020-02-11 DIAGNOSIS — B078 Other viral warts: Secondary | ICD-10-CM | POA: Diagnosis not present

## 2020-02-11 DIAGNOSIS — L578 Other skin changes due to chronic exposure to nonionizing radiation: Secondary | ICD-10-CM | POA: Diagnosis not present

## 2020-02-22 DIAGNOSIS — E785 Hyperlipidemia, unspecified: Secondary | ICD-10-CM | POA: Diagnosis not present

## 2020-02-22 DIAGNOSIS — E119 Type 2 diabetes mellitus without complications: Secondary | ICD-10-CM | POA: Diagnosis not present

## 2020-02-24 DIAGNOSIS — E119 Type 2 diabetes mellitus without complications: Secondary | ICD-10-CM | POA: Diagnosis not present

## 2020-05-23 DIAGNOSIS — E785 Hyperlipidemia, unspecified: Secondary | ICD-10-CM | POA: Diagnosis not present

## 2020-05-23 DIAGNOSIS — E119 Type 2 diabetes mellitus without complications: Secondary | ICD-10-CM | POA: Diagnosis not present

## 2020-05-25 DIAGNOSIS — E119 Type 2 diabetes mellitus without complications: Secondary | ICD-10-CM | POA: Diagnosis not present

## 2020-05-25 DIAGNOSIS — F419 Anxiety disorder, unspecified: Secondary | ICD-10-CM | POA: Diagnosis not present

## 2020-05-25 DIAGNOSIS — G20 Parkinson's disease: Secondary | ICD-10-CM | POA: Diagnosis not present

## 2020-05-25 DIAGNOSIS — E785 Hyperlipidemia, unspecified: Secondary | ICD-10-CM | POA: Diagnosis not present

## 2020-06-13 DIAGNOSIS — M1612 Unilateral primary osteoarthritis, left hip: Secondary | ICD-10-CM | POA: Diagnosis not present

## 2020-07-06 DIAGNOSIS — M25552 Pain in left hip: Secondary | ICD-10-CM | POA: Diagnosis not present

## 2020-07-25 DIAGNOSIS — M1612 Unilateral primary osteoarthritis, left hip: Secondary | ICD-10-CM | POA: Diagnosis not present

## 2020-08-25 DIAGNOSIS — G20 Parkinson's disease: Secondary | ICD-10-CM | POA: Diagnosis not present

## 2020-09-23 DIAGNOSIS — E785 Hyperlipidemia, unspecified: Secondary | ICD-10-CM | POA: Diagnosis not present

## 2020-09-23 DIAGNOSIS — E119 Type 2 diabetes mellitus without complications: Secondary | ICD-10-CM | POA: Diagnosis not present

## 2020-09-30 DIAGNOSIS — E119 Type 2 diabetes mellitus without complications: Secondary | ICD-10-CM | POA: Diagnosis not present

## 2020-09-30 DIAGNOSIS — Z23 Encounter for immunization: Secondary | ICD-10-CM | POA: Diagnosis not present

## 2020-09-30 DIAGNOSIS — N400 Enlarged prostate without lower urinary tract symptoms: Secondary | ICD-10-CM | POA: Diagnosis not present

## 2020-09-30 DIAGNOSIS — E785 Hyperlipidemia, unspecified: Secondary | ICD-10-CM | POA: Diagnosis not present

## 2020-09-30 DIAGNOSIS — G20 Parkinson's disease: Secondary | ICD-10-CM | POA: Diagnosis not present

## 2020-12-14 DIAGNOSIS — G20 Parkinson's disease: Secondary | ICD-10-CM | POA: Diagnosis not present

## 2021-02-22 ENCOUNTER — Emergency Department: Payer: PPO

## 2021-02-22 ENCOUNTER — Observation Stay
Admission: EM | Admit: 2021-02-22 | Discharge: 2021-02-24 | Disposition: A | Discharge status: Home Health | Payer: PPO | Attending: Internal Medicine | Admitting: Internal Medicine

## 2021-02-22 DIAGNOSIS — Z79899 Other long term (current) drug therapy: Secondary | ICD-10-CM | POA: Insufficient documentation

## 2021-02-22 DIAGNOSIS — E119 Type 2 diabetes mellitus without complications: Secondary | ICD-10-CM

## 2021-02-22 DIAGNOSIS — N400 Enlarged prostate without lower urinary tract symptoms: Secondary | ICD-10-CM

## 2021-02-22 DIAGNOSIS — G20 Parkinson's disease: Secondary | ICD-10-CM | POA: Insufficient documentation

## 2021-02-22 DIAGNOSIS — G459 Transient cerebral ischemic attack, unspecified: Secondary | ICD-10-CM | POA: Diagnosis not present

## 2021-02-22 DIAGNOSIS — R29810 Facial weakness: Secondary | ICD-10-CM

## 2021-02-22 DIAGNOSIS — Z85528 Personal history of other malignant neoplasm of kidney: Secondary | ICD-10-CM | POA: Diagnosis not present

## 2021-02-22 DIAGNOSIS — R4182 Altered mental status, unspecified: Secondary | ICD-10-CM

## 2021-02-22 DIAGNOSIS — R531 Weakness: Secondary | ICD-10-CM

## 2021-02-22 DIAGNOSIS — I10 Essential (primary) hypertension: Secondary | ICD-10-CM | POA: Diagnosis not present

## 2021-02-22 DIAGNOSIS — Z87891 Personal history of nicotine dependence: Secondary | ICD-10-CM | POA: Diagnosis not present

## 2021-02-22 DIAGNOSIS — R471 Dysarthria and anarthria: Secondary | ICD-10-CM | POA: Diagnosis not present

## 2021-02-22 DIAGNOSIS — Z7984 Long term (current) use of oral hypoglycemic drugs: Secondary | ICD-10-CM | POA: Diagnosis not present

## 2021-02-22 DIAGNOSIS — Z20822 Contact with and (suspected) exposure to covid-19: Secondary | ICD-10-CM | POA: Diagnosis not present

## 2021-02-22 DIAGNOSIS — G20A1 Parkinson's disease without dyskinesia, without mention of fluctuations: Secondary | ICD-10-CM

## 2021-02-22 NOTE — ED Triage Notes (Signed)
Report per EMS. Wife noticed at 2130 pt having left side weakness and partial facial droop. On EMS arrival all deficiets have resolved. Pt dnies any complaints at this time. Pt confused at baseline. Hx parkinsons, hx TIA

## 2021-02-22 NOTE — ED Notes (Signed)
ED Provider at bedside. 

## 2021-02-22 NOTE — ED Notes (Signed)
Patient transported to CT 

## 2021-02-22 NOTE — ED Provider Notes (Signed)
[facility] Provider Note    Event Date/Time   First MD Initiated Contact with Patient 02/22/21 2305     (approximate)   History   Weakness   HPI  Mark Riggs is a 81 y.o. male  brought to the ED via EMS from home with a chief complaint of generalized weakness, altered mental state, left side weakness/numbness and left facial droop. Wife noted symptoms around 9:30pm. Symptoms lasted 10-15 minutes then resolved. Wife states patient is more confused than usual. Denies recent fever, cough, chest pain, shortness of breath, abdominal pain, nausea, vomiting or diarrhea.    Past Medical History   Past Medical History:  Diagnosis Date   BPH (benign prostatic hyperplasia)    Diabetes mellitus without complication ([place])    Hyperlipemia    Hypertension    Renal cancer ([place]) 2007   left   Renal mass      Active Problem List   Patient Active Problem List   Diagnosis Date Noted   S/P lumbar spine operation 09/22/2018   Left flank pain 12/02/2017     Past Surgical History   Past Surgical History:  Procedure Laterality Date   APPENDECTOMY     LUMBAR LAMINECTOMY/ DECOMPRESSION WITH MET-RX N/A 09/22/2018   Procedure: L3-4 LAMINECTOMY;  Surgeon: Deetta Perla, MD;  Location: [facility] ORS;  Service: Neurosurgery;  Laterality: N/A;   RENAL MASS EXCISION  2008   TONSILLECTOMY     TRANSURETHRAL RESECTION OF PROSTATE     patient unsure about this surgery long time ago by Dr. Ernst Spell     Home Medications   Prior to Admission medications   Medication Sig Start Date End Date Taking? Authorizing Provider  ALPRAZolam Duanne Moron) 1 MG tablet Take 1 mg by mouth 3 (three) times daily as needed for anxiety.   Yes [provider]  carbidopa-levodopa (SINEMET CR) 50-200 MG tablet Take 1 tablet by mouth at bedtime. 02/02/21  Yes [provider]  carbidopa-levodopa (SINEMET IR) 25-100 MG tablet Take 2 tablets by mouth 3 (three) times daily.  07/07/18  Yes  [provider]  cholecalciferol (VITAMIN D3) 25 MCG (1000 UT) tablet Take 1,000 Units by mouth daily.   Yes [provider]  glipiZIDE (GLUCOTROL XL) 2.5 MG 24 hr tablet Take 2.5 mg by mouth daily with breakfast. 11/20/20  Yes [provider]  lisinopril (ZESTRIL) 2.5 MG tablet Take 2.5 mg by mouth daily.    Yes [provider]  metFORMIN (GLUCOPHAGE) 500 MG tablet Take 500 mg by mouth daily with breakfast. 11/20/20  Yes [provider]  rOPINIRole (REQUIP) 0.25 MG tablet Take 0.25 mg by mouth in the morning, at noon, in the evening, and at bedtime. 11/20/20  Yes [provider]  simvastatin (ZOCOR) 40 MG tablet Take 40 mg by mouth daily.   Yes [provider]  tamsulosin (FLOMAX) 0.4 MG CAPS capsule Take 0.4 mg by mouth daily after supper. 12/26/20  Yes [provider]  vitamin B-12 (CYANOCOBALAMIN) 1000 MCG tablet Take 1,000 mcg by mouth daily.   Yes [provider]     Allergies  Contrast media [iodinated contrast media], Sulfa antibiotics, and Tetracyclines & related   Family History  No family history on file.   Physical Exam  Triage Vital Signs: ED Triage Vitals [02/22/21 2241]  Enc Vitals Group     BP (!) 182/86     Pulse Rate 75     Resp 16     Temp  97.6 F (36.4 C)     Temp Source Oral     SpO2 100 %     Weight      Height      Head Circumference      Peak Flow      Pain Score      Pain Loc      Pain Edu?      Excl. in [place]?     Updated Vital Signs: BP (!) 157/66    Pulse 64    Temp 97.6 F (36.4 C) (Oral)    Resp 16    SpO2 98%    General: Awake, no distress.  CV:  RRR. Good peripheral perfusion.  Resp:  Normal effort. CTAB. Abd:  Nontender. No distention.  Other:  Alert and oriented to person and place. CN II-XII grossly intact. No pronator drift. 4/5 motor strength all extremities. Sensation intact. LUE essential tremor - baseline per wife.   ED Results / Procedures /  Treatments  Labs (all labs ordered are listed, but only abnormal results are displayed) Labs Reviewed  COMPREHENSIVE METABOLIC PANEL - Abnormal; Notable for the following components:      Result Value   Glucose, Bld 163 (*)    All other components within normal limits  URINALYSIS, ROUTINE W REFLEX MICROSCOPIC - Abnormal; Notable for the following components:   Color, Urine YELLOW (*)    APPearance CLEAR (*)    Ketones, ur 5 (*)    All other components within normal limits  CBC WITH DIFFERENTIAL/PLATELET - Abnormal; Notable for the following components:   HCT 37.8 (*)    All other components within normal limits  RESP PANEL BY RT-PCR (FLU A&B, COVID) ARPGX2  CULTURE, BLOOD (ROUTINE X 2)  CULTURE, BLOOD (ROUTINE X 2)  URINE CULTURE  LACTIC ACID, PLASMA  PROTIME-INR  APTT  URINE DRUG SCREEN, QUALITATIVE ([facility] ONLY)  LACTIC ACID, PLASMA  TROPONIN I (HIGH SENSITIVITY)     EKG  ED ECG REPORT I, Mishawn Hemann J, the attending physician, personally viewed and interpreted this ECG.   Date: 02/22/2021  EKG Time: 2244  Rate: 75  Rhythm: normal sinus rhythm  Axis: Normal  Intervals:none  ST&T Change: Nonspecific    RADIOLOGY I independently interpreted and visualized the chest xray, CT Head as well as the radiology interpretation:  Chest xray: No acute cardiopulmonary process  CT Head: No ICH  Official radiology report(s): CT HEAD WO CONTRAST  Result Date: 02/22/2021 CLINICAL DATA:  Mental status change left-sided weakness EXAM: CT HEAD WITHOUT CONTRAST TECHNIQUE: Contiguous axial images were obtained from the base of the skull through the vertex without intravenous contrast. RADIATION DOSE REDUCTION: This exam was performed according to the departmental dose-optimization program which includes automated exposure control, adjustment of the mA and/or kV according to patient size and/or use of iterative reconstruction technique. COMPARISON:  MRI brain 03/10/2018, CT brain  04/29/2017 FINDINGS: Brain: No acute territorial infarction, hemorrhage, or intracranial mass. Mild atrophy. Moderate hypodensity in the white matter consistent with chronic small vessel ischemic change. Stable ventricle size. Vascular: No hyperdense vessels.  Carotid vascular calcification Skull: Normal. Negative for fracture or focal lesion. Sinuses/Orbits: No acute finding. Other: None IMPRESSION: 1. No CT evidence for acute intracranial abnormality. 2. Atrophy and chronic small vessel ischemic changes of the white matter. Electronically Signed   By: Donavan Foil M.D.   On: 02/22/2021 23:42   DG Chest Port 1 View  Result Date: 02/22/2021 CLINICAL DATA:  Weakness EXAM: PORTABLE CHEST  1 VIEW COMPARISON:  04/29/2017 FINDINGS: The heart size and mediastinal contours are within normal limits. Both lungs are clear. Aortic atherosclerosis. The visualized skeletal structures are unremarkable. IMPRESSION: No active disease. Electronically Signed   By: Donavan Foil M.D.   On: 02/22/2021 23:35     PROCEDURES:  Critical Care performed: No  .1-3 Lead EKG Interpretation Performed by: Paulette Blanch, MD Authorized by: Paulette Blanch, MD     Interpretation: normal     ECG rate:  75   ECG rate assessment: normal     Rhythm: sinus rhythm     Ectopy: none     Conduction: normal   Comments:     Patient placed on cardiac monitor to monitor for arrhythmias  NIH Stroke Scale  Interval: Baseline Time: 12:58 AM Person Administering Scale: Emmely Bittinger J  Administer stroke scale items in the order listed. Record performance in each category after each subscale exam. Do not go back and change scores. Follow directions provided for each exam technique. Scores should reflect what the patient does, not what the clinician thinks the patient can do. The clinician should record answers while administering the exam and work quickly. Except where indicated, the patient should not be coached (i.e., repeated requests to  patient to make a special effort).   1a  Level of consciousness: 0=alert; keenly responsive  1b. LOC questions:  0=Performs both tasks correctly  1c. LOC commands: 0=Performs both tasks correctly  2.  Best Gaze: 0=normal  3.  Visual: 0=No visual loss  4. Facial Palsy: 0=Normal symmetric movement  5a.  Motor left arm: 0=No drift, limb holds 90 (or 45) degrees for full 10 seconds  5b.  Motor right arm: 0=No drift, limb holds 90 (or 45) degrees for full 10 seconds  6a. motor left leg: 0=No drift, limb holds 90 (or 45) degrees for full 10 seconds  6b  Motor right leg:  0=No drift, limb holds 90 (or 45) degrees for full 10 seconds  7. Limb Ataxia: 0=Absent  8.  Sensory: 0=Normal; no sensory loss  9. Best Language:  0=No aphasia, normal  10. Dysarthria: 0=Normal  11. Extinction and Inattention: 0=No abnormality  12. Distal motor function: 0=Normal   Total:   0     MEDICATIONS ORDERED IN ED: Medications  aspirin chewable tablet 324 mg (has no administration in time range)     IMPRESSION / MDM / ASSESSMENT AND PLAN / ED COURSE  I reviewed the triage vital signs and the nursing notes.                             81 year old male presenting with transient facial droop, weakness, AMS. Differential diagnosis includes, but is not limited to, TIA, CVA,alcohol, illicit or prescription medications, or other toxic ingestion; intracranial pathology such as stroke or intracerebral hemorrhage; fever or infectious causes including sepsis; hypoxemia and/or hypercarbia; uremia; trauma; endocrine related disorders such as diabetes, hypoglycemia, and thyroid-related diseases; hypertensive encephalopathy; etc. I have personally reviewed patient's chart and note his neurology office visit on 12/14/2020.  The patient is on the cardiac monitor to evaluate for evidence of arrhythmia and/or significant heart rate changes.  We will obtain stroke and sepsis work-up, CT head, chest x-ray.  Patient does not meet  criteria for code stroke as symptoms lasted 10 to 15 minutes and have completely resolved.  Will reassess.  Anticipate hospitalization for TIA, AMS, generalized weakness.  Clinical  Course as of 02/23/21 0058  Thu Feb 23, 2021  0056 Laboratory results demonstrate normal white count, normal electrolytes, mild ketonuria, negative troponin, unremarkable lactic acid and coagulation factors.  CT head demonstrates no ICH, chest x-ray stable.  At this time will consult hospitalist services for evaluation and admission for TIA/stroke work-up. [JS]    Clinical Course User Index [JS] Paulette Blanch, MD     FINAL CLINICAL IMPRESSION(S) / ED DIAGNOSES   Final diagnoses:  Generalized weakness  Altered mental status, unspecified altered mental status type  TIA (transient ischemic attack)     Rx / DC Orders   ED Discharge Orders     None        Note:  This document was prepared using Dragon voice recognition software and may include unintentional dictation errors.   Paulette Blanch, MD 02/23/21 [phone]

## 2021-02-23 ENCOUNTER — Encounter: Payer: Self-pay | Admitting: Internal Medicine

## 2021-02-23 ENCOUNTER — Observation Stay
Admit: 2021-02-23 | Discharge: 2021-02-23 | Disposition: A | Discharge status: Home / Self Care | Payer: PPO | Attending: Internal Medicine | Admitting: Internal Medicine

## 2021-02-23 ENCOUNTER — Other Ambulatory Visit: Payer: Self-pay

## 2021-02-23 ENCOUNTER — Observation Stay: Payer: PPO

## 2021-02-23 DIAGNOSIS — G459 Transient cerebral ischemic attack, unspecified: Secondary | ICD-10-CM | POA: Diagnosis not present

## 2021-02-23 DIAGNOSIS — N400 Enlarged prostate without lower urinary tract symptoms: Secondary | ICD-10-CM

## 2021-02-23 DIAGNOSIS — E119 Type 2 diabetes mellitus without complications: Secondary | ICD-10-CM | POA: Diagnosis not present

## 2021-02-23 DIAGNOSIS — G20 Parkinson's disease: Secondary | ICD-10-CM

## 2021-02-23 DIAGNOSIS — I10 Essential (primary) hypertension: Secondary | ICD-10-CM | POA: Diagnosis not present

## 2021-02-23 DIAGNOSIS — E7849 Other hyperlipidemia: Secondary | ICD-10-CM | POA: Diagnosis not present

## 2021-02-23 LAB — CBC WITH DIFFERENTIAL/PLATELET
Abs Immature Granulocytes: 0.03 10*3/uL (ref 0.00–0.07)
Basophils Absolute: 0.1 10*3/uL (ref 0.0–0.1)
Basophils Relative: 1 %
Eosinophils Absolute: 0.1 10*3/uL (ref 0.0–0.5)
Eosinophils Relative: 1 %
HCT: 37.8 % — ABNORMAL LOW (ref 39.0–52.0)
Hemoglobin: 13.2 g/dL (ref 13.0–17.0)
Immature Granulocytes: 0 %
Lymphocytes Relative: 21 %
Lymphs Abs: 1.6 10*3/uL (ref 0.7–4.0)
MCH: 30.6 pg (ref 26.0–34.0)
MCHC: 34.9 g/dL (ref 30.0–36.0)
MCV: 87.5 fL (ref 80.0–100.0)
Monocytes Absolute: 0.5 10*3/uL (ref 0.1–1.0)
Monocytes Relative: 7 %
Neutro Abs: 5.4 10*3/uL (ref 1.7–7.7)
Neutrophils Relative %: 70 %
Platelets: 152 10*3/uL (ref 150–400)
RBC: 4.32 MIL/uL (ref 4.22–5.81)
RDW: 12.6 % (ref 11.5–15.5)
WBC: 7.6 10*3/uL (ref 4.0–10.5)
nRBC: 0 % (ref 0.0–0.2)

## 2021-02-23 LAB — URINE DRUG SCREEN, QUALITATIVE (ARMC ONLY)
Amphetamines, Ur Screen: NOT DETECTED
Barbiturates, Ur Screen: NOT DETECTED
Benzodiazepine, Ur Scrn: POSITIVE — AB
Cannabinoid 50 Ng, Ur ~~LOC~~: NOT DETECTED
Cocaine Metabolite,Ur ~~LOC~~: NOT DETECTED
MDMA (Ecstasy)Ur Screen: NOT DETECTED
Methadone Scn, Ur: NOT DETECTED
Opiate, Ur Screen: NOT DETECTED
Phencyclidine (PCP) Ur S: NOT DETECTED
Tricyclic, Ur Screen: NOT DETECTED

## 2021-02-23 LAB — COMPREHENSIVE METABOLIC PANEL
ALT: 7 U/L (ref 0–44)
AST: 18 U/L (ref 15–41)
Albumin: 4.7 g/dL (ref 3.5–5.0)
Alkaline Phosphatase: 97 U/L (ref 38–126)
Anion gap: 8 (ref 5–15)
BUN: 19 mg/dL (ref 8–23)
CO2: 25 mmol/L (ref 22–32)
Calcium: 9.4 mg/dL (ref 8.9–10.3)
Chloride: 102 mmol/L (ref 98–111)
Creatinine, Ser: 1.12 mg/dL (ref 0.61–1.24)
GFR, Estimated: >60 mL/min (ref >60)
Glucose, Bld: 163 mg/dL — ABNORMAL HIGH (ref 70–99)
Potassium: 4.2 mmol/L (ref 3.5–5.1)
Sodium: 135 mmol/L (ref 135–145)
Total Bilirubin: 1.2 mg/dL (ref 0.3–1.2)
Total Protein: 7.3 g/dL (ref 6.5–8.1)

## 2021-02-23 LAB — BASIC METABOLIC PANEL
Anion gap: 7 (ref 5–15)
BUN: 17 mg/dL (ref 8–23)
CO2: 26 mmol/L (ref 22–32)
Calcium: 8.9 mg/dL (ref 8.9–10.3)
Chloride: 100 mmol/L (ref 98–111)
Creatinine, Ser: 1.02 mg/dL (ref 0.61–1.24)
GFR, Estimated: >60 mL/min (ref >60)
Glucose, Bld: 173 mg/dL — ABNORMAL HIGH (ref 70–99)
Potassium: 4 mmol/L (ref 3.5–5.1)
Sodium: 133 mmol/L — ABNORMAL LOW (ref 135–145)

## 2021-02-23 LAB — URINALYSIS, ROUTINE W REFLEX MICROSCOPIC
Bilirubin Urine: NEGATIVE
Glucose, UA: NEGATIVE mg/dL
Hgb urine dipstick: NEGATIVE
Ketones, ur: 5 mg/dL — AB
Leukocytes,Ua: NEGATIVE
Nitrite: NEGATIVE
Protein, ur: NEGATIVE mg/dL
Specific Gravity, Urine: 1.018 (ref 1.005–1.030)
pH: 5 (ref 5.0–8.0)

## 2021-02-23 LAB — PROTIME-INR
INR: 1.1 (ref 0.8–1.2)
Prothrombin Time: 14.3 seconds (ref 11.4–15.2)

## 2021-02-23 LAB — ECHOCARDIOGRAM COMPLETE
AR max vel: 2.43 cm2
AV Area VTI: 2.78 cm2
AV Area mean vel: 2.25 cm2
AV Mean grad: 4 mmHg
AV Peak grad: 6.3 mmHg
Ao pk vel: 1.25 m/s
Area-P 1/2: 2.68 cm2
Height: 67 in
MV VTI: 2.56 cm2
S' Lateral: 2.2 cm
Weight: 2880 oz

## 2021-02-23 LAB — GLUCOSE, CAPILLARY
Glucose-Capillary: 156 mg/dL — ABNORMAL HIGH (ref 70–99)
Glucose-Capillary: 165 mg/dL — ABNORMAL HIGH (ref 70–99)

## 2021-02-23 LAB — LIPID PANEL
Cholesterol: 101 mg/dL (ref 0–200)
HDL: 34 mg/dL — ABNORMAL LOW (ref >40)
LDL Cholesterol: 52 mg/dL (ref 0–99)
Total CHOL/HDL Ratio: 3 RATIO
Triglycerides: 73 mg/dL (ref <150)
VLDL: 15 mg/dL (ref 0–40)

## 2021-02-23 LAB — RESP PANEL BY RT-PCR (FLU A&B, COVID) ARPGX2
Influenza A by PCR: NEGATIVE
Influenza B by PCR: NEGATIVE
SARS Coronavirus 2 by RT PCR: NEGATIVE

## 2021-02-23 LAB — TROPONIN I (HIGH SENSITIVITY): Troponin I (High Sensitivity): 4 ng/L (ref <18)

## 2021-02-23 LAB — CBG MONITORING, ED
Glucose-Capillary: 154 mg/dL — ABNORMAL HIGH (ref 70–99)
Glucose-Capillary: 132 mg/dL — ABNORMAL HIGH (ref 70–99)
Glucose-Capillary: 180 mg/dL — ABNORMAL HIGH (ref 70–99)

## 2021-02-23 LAB — APTT: aPTT: 36 seconds (ref 24–36)

## 2021-02-23 LAB — HEMOGLOBIN A1C
Hgb A1c MFr Bld: 6 % — ABNORMAL HIGH (ref 4.8–5.6)
Mean Plasma Glucose: 125.5 mg/dL

## 2021-02-23 LAB — LACTIC ACID, PLASMA: Lactic Acid, Venous: 1.6 mmol/L (ref 0.5–1.9)

## 2021-02-23 MED ORDER — INSULIN ASPART 100 UNIT/ML IJ SOLN: 0.0000 [IU] | Freq: Every day | INTRAMUSCULAR | Status: DC

## 2021-02-23 MED ORDER — TAMSULOSIN HCL 0.4 MG PO CAPS
0.4000 mg | ORAL_CAPSULE | Freq: Every day | ORAL | Status: DC
  Administered 2021-02-23: 0.4 mg via ORAL
  Filled 2021-02-23: qty 1

## 2021-02-23 MED ORDER — ACETAMINOPHEN 650 MG RE SUPP: 650.0000 mg | RECTAL | Status: DC | PRN

## 2021-02-23 MED ORDER — ROPINIROLE HCL 0.25 MG PO TABS
0.2500 mg | ORAL_TABLET | Freq: Four times a day (QID) | ORAL | Status: DC
  Administered 2021-02-23 (×3): 0.25 mg via ORAL
  Filled 2021-02-23 (×6): qty 1

## 2021-02-23 MED ORDER — DIAZEPAM 5 MG/ML IJ SOLN
5.0000 mg | INTRAMUSCULAR | Status: AC
  Administered 2021-02-23: 5 mg via INTRAVENOUS
  Filled 2021-02-23: qty 2

## 2021-02-23 MED ORDER — ROPINIROLE HCL 0.25 MG PO TABS
0.2500 mg | ORAL_TABLET | Freq: Three times a day (TID) | ORAL | Status: DC
  Administered 2021-02-23: 0.25 mg via ORAL
  Filled 2021-02-23 (×3): qty 1

## 2021-02-23 MED ORDER — ASPIRIN 81 MG PO CHEW
324.0000 mg | CHEWABLE_TABLET | Freq: Once | ORAL | Status: AC
  Administered 2021-02-23: 324 mg via ORAL
  Filled 2021-02-23: qty 4

## 2021-02-23 MED ORDER — INSULIN ASPART 100 UNIT/ML IJ SOLN
0.0000 [IU] | Freq: Three times a day (TID) | INTRAMUSCULAR | Status: DC
  Administered 2021-02-23: 2 [IU] via SUBCUTANEOUS
  Administered 2021-02-23 – 2021-02-24 (×3): 3 [IU] via SUBCUTANEOUS
  Administered 2021-02-24: 09:00:00 5 [IU] via SUBCUTANEOUS
  Filled 2021-02-23 (×4): qty 1

## 2021-02-23 MED ORDER — ENOXAPARIN SODIUM 40 MG/0.4ML IJ SOSY
40.0000 mg | PREFILLED_SYRINGE | INTRAMUSCULAR | Status: DC
  Administered 2021-02-23 – 2021-02-24 (×2): 40 mg via SUBCUTANEOUS
  Filled 2021-02-23 (×2): qty 0.4

## 2021-02-23 MED ORDER — VITAMIN D 25 MCG (1000 UNIT) PO TABS
1000.0000 [IU] | ORAL_TABLET | Freq: Every day | ORAL | Status: DC
  Administered 2021-02-23 – 2021-02-24 (×2): 1000 [IU] via ORAL
  Filled 2021-02-23 (×2): qty 1

## 2021-02-23 MED ORDER — CARBIDOPA-LEVODOPA 25-100 MG PO TABS
2.0000 | ORAL_TABLET | Freq: Three times a day (TID) | ORAL | Status: DC
  Administered 2021-02-23: 2 via ORAL
  Filled 2021-02-23 (×3): qty 2

## 2021-02-23 MED ORDER — STROKE: EARLY STAGES OF RECOVERY BOOK: Freq: Once | Status: DC

## 2021-02-23 MED ORDER — CARBIDOPA-LEVODOPA ER 50-200 MG PO TBCR
1.0000 | EXTENDED_RELEASE_TABLET | Freq: Every day | ORAL | Status: DC
  Administered 2021-02-23: 1 via ORAL
  Filled 2021-02-23 (×3): qty 1

## 2021-02-23 MED ORDER — ACETAMINOPHEN 325 MG PO TABS: 650.0000 mg | ORAL_TABLET | ORAL | Status: DC | PRN

## 2021-02-23 MED ORDER — ACETAMINOPHEN 160 MG/5ML PO SOLN
650.0000 mg | ORAL | Status: DC | PRN
  Filled 2021-02-23: qty 20.3

## 2021-02-23 MED ORDER — ASPIRIN EC 81 MG PO TBEC
81.0000 mg | DELAYED_RELEASE_TABLET | Freq: Every day | ORAL | Status: DC
  Administered 2021-02-23 – 2021-02-24 (×2): 81 mg via ORAL
  Filled 2021-02-23 (×2): qty 1

## 2021-02-23 MED ORDER — CARBIDOPA-LEVODOPA ER 50-200 MG PO TBCR
1.0000 | EXTENDED_RELEASE_TABLET | Freq: Once | ORAL | Status: AC
  Administered 2021-02-23: 1 via ORAL
  Filled 2021-02-23: qty 1

## 2021-02-23 MED ORDER — CARBIDOPA-LEVODOPA 25-100 MG PO TABS
2.0000 | ORAL_TABLET | Freq: Four times a day (QID) | ORAL | Status: DC
  Administered 2021-02-23 (×2): 2 via ORAL
  Filled 2021-02-23 (×6): qty 2

## 2021-02-23 MED ORDER — ALPRAZOLAM 1 MG PO TABS
1.0000 mg | ORAL_TABLET | Freq: Three times a day (TID) | ORAL | Status: DC | PRN
  Administered 2021-02-23 – 2021-02-24 (×3): 1 mg via ORAL
  Filled 2021-02-23 (×3): qty 1

## 2021-02-23 MED ORDER — VITAMIN B-12 1000 MCG PO TABS
1000.0000 ug | ORAL_TABLET | Freq: Every day | ORAL | Status: DC
  Administered 2021-02-23 – 2021-02-24 (×2): 1000 ug via ORAL
  Filled 2021-02-23 (×2): qty 1

## 2021-02-23 MED ORDER — SIMVASTATIN 20 MG PO TABS
40.0000 mg | ORAL_TABLET | Freq: Every day | ORAL | Status: DC
  Administered 2021-02-23 – 2021-02-24 (×2): 40 mg via ORAL
  Filled 2021-02-23: qty 2
  Filled 2021-02-23: qty 4

## 2021-02-23 MED ORDER — CLOPIDOGREL BISULFATE 75 MG PO TABS
75.0000 mg | ORAL_TABLET | Freq: Every day | ORAL | Status: DC
  Administered 2021-02-23 – 2021-02-24 (×2): 75 mg via ORAL
  Filled 2021-02-23 (×2): qty 1

## 2021-02-23 MED ORDER — SODIUM CHLORIDE 0.9 % IV SOLN: INTRAVENOUS | Status: AC

## 2021-02-23 NOTE — Evaluation (Signed)
Physical Therapy Evaluation Patient Details Name: Mark Riggs MRN: [id number] DOB: January 26, 1940 Today's Date: 02/23/2021  History of Present Illness  presented to ER secondary to L-sided weakness, numbness, L facial droop; admitted for TIA/CVA work up.  MRI negative for acute infarct.  Clinical Impression  Patient resting in bed; wife at bedside.  Alert and oriented to basic information; follows commands and eager for session/OOB activities due to cramping in bilat LEs.  Mild weakness, tremors and coordination deficits noted to L UE; baseline per patient report (due to Parkinson's).  Feels all initial-presenting symptoms have fully resolved.  Able to complete bed mobility with mod indep; sit/stand, basic transfers and gait (50') with RW, cga/min assist.  Demonstratse short, shuffling steps with decreased step height/length; limited dynamic balance reactions (L lateral LOB x1, min assist to recover).  Do recommend continued use of RW for all gait efforts at this time Would benefit from skilled PT to address above deficits and promote optimal return to PLOF.; Recommend transition to HHPT upon discharge from acute hospitalization.        Recommendations for follow up therapy are one component of a multi-disciplinary discharge planning process, led by the attending physician.  Recommendations may be updated based on patient status, additional functional criteria and insurance authorization.  Follow Up Recommendations Home health PT    Assistance Recommended at Discharge PRN  Patient can return home with the following  A little help with walking and/or transfers;A little help with bathing/dressing/bathroom    Equipment Recommendations    Recommendations for Other Services       Functional Status Assessment Patient has had a recent decline in their functional status and demonstrates the ability to make significant improvements in function in a reasonable and predictable amount of time.      Precautions / Restrictions Precautions Precautions: Fall Restrictions Weight Bearing Restrictions: No      Mobility  Bed Mobility Overal bed mobility: Modified Independent                  Transfers Overall transfer level: Needs assistance Equipment used: None Transfers: Sit to/from Stand Sit to Stand: Min guard, Min assist           General transfer comment: transitions to standing with forward flexed trunk/posture; fully extends hips/trunk once standing on feet.  Single anterior LOB x1, min assist for correction.    Ambulation/Gait Ambulation/Gait assistance: Min guard Gait Distance (Feet): 50 Feet Assistive device: Rolling walker (2 wheels)         General Gait Details: short, shuffling steps with decreased step height/length; limited dynamic balance reactions (L lateral LOB x1, min assist to recover).  Do recommend continued use of RW for all gait efforts at this time  Science writer    Modified Rankin (Stroke Patients Only)       Balance Overall balance assessment: Needs assistance Sitting-balance support: No upper extremity supported, Feet supported Sitting balance-Leahy Scale: Good     Standing balance support: Bilateral upper extremity supported Standing balance-Leahy Scale: Fair                               Pertinent Vitals/Pain Pain Assessment Pain Assessment: No/denies pain    Home Living Family/patient expects to be discharged to:: Private residence Living Arrangements: Spouse/significant other Available Help at Discharge: Family;Available 24 hours/day Type of Home: House Home Access:  Stairs to enter Entrance Stairs-Rails: None Entrance Stairs-Number of Steps: 1   Home Layout: One level Home Equipment: Conservation officer, nature (2 wheels)      Prior Function Prior Level of Function : Independent/Modified Independent             Mobility Comments: Indep with ADLs, household and community  mobilization without assist device; denies fall history.       Hand Dominance        Extremity/Trunk Assessment   Upper Extremity Assessment Upper Extremity Assessment:  (L UE grossly 4/5 throughout, mild tremors and decreased fine motor/RAMPS (baseline due to parkinsons per patient).  R UE grossly WFL)    Lower Extremity Assessment Lower Extremity Assessment: Overall WFL for tasks assessed (grossly 4+/5 throughout bilat LEs)       Communication   Communication: No difficulties  Cognition Arousal/Alertness: Awake/alert Behavior During Therapy: WFL for tasks assessed/performed Overall Cognitive Status: Within Functional Limits for tasks assessed                                          General Comments      Exercises Other Exercises Other Exercises: Reviewed role of PT and progressive mobility, use of RW and overall safety needs; patient/wife voiced understanding Other Exercises: Toilet transfer, ambulatory with RW, cga/min assist for balance with turn when approaching seating surface   Assessment/Plan    PT Assessment Patient needs continued PT services  PT Problem List Decreased strength;Decreased range of motion;Decreased activity tolerance;Decreased balance;Decreased mobility;Decreased coordination;Decreased safety awareness;Decreased knowledge of precautions;Cardiopulmonary status limiting activity;Decreased knowledge of use of DME       PT Treatment Interventions DME instruction;Gait training;Stair training;Functional mobility training;Therapeutic activities;Therapeutic exercise;Balance training;Patient/family education;Neuromuscular re-education;Cognitive remediation    PT Goals (Current goals can be found in the Care Plan section)  Acute Rehab PT Goals Patient Stated Goal: to move around and stretch my legs PT Goal Formulation: With patient/family Time For Goal Achievement: 03/09/21 Potential to Achieve Goals: Good    Frequency Min 2X/week      Co-evaluation               AM-PAC PT "6 Clicks" Mobility  Outcome Measure Help needed turning from your back to your side while in a flat bed without using bedrails?: None Help needed moving from lying on your back to sitting on the side of a flat bed without using bedrails?: A Little Help needed moving to and from a bed to a chair (including a wheelchair)?: A Little Help needed standing up from a chair using your arms (e.g., wheelchair or bedside chair)?: A Little Help needed to walk in hospital room?: A Little Help needed climbing 3-5 steps with a railing? : A Little 6 Click Score: 19    End of Session Equipment Utilized During Treatment: Gait belt Activity Tolerance: Patient tolerated treatment well Patient left:  (transitioned/handoff to OT end of session)   PT Visit Diagnosis: Muscle weakness (generalized) (M62.81);Difficulty in walking, not elsewhere classified (R26.2)    Time: 3536-1443 PT Time Calculation (min) (ACUTE ONLY): 24 min   Charges:   PT Evaluation $PT Eval Moderate Complexity: 1 Mod PT Treatments $Therapeutic Activity: 8-22 mins      Hazyl Marseille H. Owens Shark, PT, DPT, NCS 02/23/21, 2:09 PM [phone]

## 2021-02-23 NOTE — Progress Notes (Signed)
*  PRELIMINARY RESULTS* Echocardiogram 2D Echocardiogram has been performed.  Sherrie Sport 02/23/2021, 1:10 PM

## 2021-02-23 NOTE — Assessment & Plan Note (Addendum)
-  Patient did present with left-sided weakness and numbness, left facial droop lasting about 15 minutes which resolved with confusion beyond baseline.   -Confusion improved and was back to baseline by day of discharge.  -CT head negative for any acute abnormalities.   -MRI brain negative for any acute abnormalities.   -Patient remained asymptomatic with resolution of facial droop, resolution of left-sided weakness and numbness.   -2D echo done with normal EF, no PFO was noted, no source of emboli noted.   -Carotid Dopplers done with no significant ICA stenosis.   -Patient received aspirin 325 mg on presentation to the ED, maintained on Zocor 40 mg daily.   -Patient seen by neurology who recommended aspirin 81 mg daily, Plavix 75 mg x 3 weeks and then aspirin only.   -Patient seen by PT/OT/ST.   -Outpatient follow-up with primary neurologist in 3 weeks.  Marland Kitchen

## 2021-02-23 NOTE — Evaluation (Signed)
Speech Language Pathology Evaluation Patient Details Name: Mark Riggs MRN: [id number] DOB: 1940-06-23 Today's Date: 02/23/2021 Time: 3299-2426 SLP Time Calculation (min) (ACUTE ONLY): 40 min  Problem List:  Patient Active Problem List   Diagnosis Date Noted   TIA (transient ischemic attack) 02/23/2021   Parkinson's disease ([place]) 02/23/2021   Diabetes mellitus without complication ([place])    Hypertension    BPH (benign prostatic hyperplasia)    S/P lumbar spine operation 09/22/2018   Left flank pain 12/02/2017   Past Medical History:  Past Medical History:  Diagnosis Date   BPH (benign prostatic hyperplasia)    Diabetes mellitus without complication ([place])    Hyperlipemia    Hypertension    Renal cancer ([place]) 2007   left   Renal mass    Past Surgical History:  Past Surgical History:  Procedure Laterality Date   APPENDECTOMY     LUMBAR LAMINECTOMY/ DECOMPRESSION WITH MET-RX N/A 09/22/2018   Procedure: L3-4 LAMINECTOMY;  Surgeon: Deetta Perla, MD;  Location: [facility] ORS;  Service: Neurosurgery;  Laterality: N/A;   RENAL MASS EXCISION  2008   TONSILLECTOMY     TRANSURETHRAL RESECTION OF PROSTATE     patient unsure about this surgery long time ago by Dr. Ernst Spell   HPI:  Per 67 H&P "Mark Riggs is a 81 y.o. male with medical history significant of Parkinson's disease, DM, HTN who presents to the ED by EMS for evaluation of left-sided weakness and numbness including left facial droop that started on the night of 2/15 at around 9:30 PM lasting 15 minutes then completely resolving.  It was associated by confusion beyond baseline.  Patient was previously in his usual state of health on the night recent illness such as fever, cough, shortness of breath, nausea, vomiting, diarrhea or abdominal pain     ED course: On arrival blood pressure 182/86 with otherwise normal vitals  Blood work for the most part unremarkable  COVID and flu negative, urinalysis negative  UDS positive  for benzodiazepines  EKG: Sinus at 75 with nonspecific ST-T wave changes  CT head with no acute intracranial abnormality     Code stroke was not called due to complete resolution of patient's symptoms by arrival and NIH of 0  Hospitalist consulted for admission." MRI brain, 02/23/21, "1. No acute intracranial abnormality.  2. Findings of chronic ischemic microangiopathy and generalized  volume loss."   Assessment / Plan / Recommendation Clinical Impression  Pt seen for cognitive-linguistic evaluation. Pt alert, pleasant, and cooperative. Flat affect. BUE and BLE tremors noted. Pt requesting Sinemet - RN made aware. Wife at bedside. Cleared with RN.   Pt assessed via informal means. Pt demonstrated intact basic cognitive-linguistic ability including attention, memory, and problem solving/insight. Pt able to ask pertinent questions involving his care while conversing with MD. Pt with s/sx dysarthria c/b intermittent hypophonia and intermittent short rushes of speech/festinations. Dysarthria most c/w hypokinetic dysarthria. SLP noted pt with hx of Parkinson's Disease. Pt able to independently repair communication breakdowns.  Per wife, pt is at speech and cognitive-linguistic baseline.   SLP to sign off as pt has no acute SLP needs at this time. Pt, pt's wife, and RN made aware of SLP POC. Pt left in the care of RN with wife at bedside.     SLP Assessment  SLP Recommendation/Assessment: Patient does not need any further Speech Lanaguage Pathology Services SLP Visit Diagnosis: Dysarthria and anarthria (R47.1)    Recommendations for follow up therapy are one component of  a multi-disciplinary discharge planning process, led by the attending physician.  Recommendations may be updated based on patient status, additional functional criteria and insurance authorization.    Follow Up Recommendations  No SLP follow up    Assistance Recommended at Discharge   (defer to OT/OT)  Functional Status Assessment  Patient has not had a recent decline in their functional status     SLP Evaluation Cognition  Overall Cognitive Status: Within Functional Limits for tasks assessed Arousal/Alertness: Awake/alert Orientation Level: Oriented X4 Memory: Appears intact Problem Solving: Appears intact Safety/Judgment: Appears intact       Comprehension  Auditory Comprehension Overall Auditory Comprehension: Appears within functional limits for tasks assessed Yes/No Questions: Within Functional Limits Commands: Within Functional Limits    Expression Expression Primary Mode of Expression: Verbal Verbal Expression Overall Verbal Expression: Appears within functional limits for tasks assessed Initiation: No impairment Automatic Speech:  (WFL) Level of Generative/Spontaneous Verbalization: Conversation [facility]) Naming: No impairment   Oral / Motor  Oral Motor/Sensory Function Overall Oral Motor/Sensory Function: Mild impairment Lingual ROM:  (tremor with lateralization) Motor Speech Overall Motor Speech: Impaired Respiration: Within functional limits Phonation: Low vocal intensity (intermittent hypophonia; wife stated baseline; noted pt with hx of Parkinson's Disease; pt able to independently repair communication breakdowns) Resonance: Within functional limits Articulation: Within functional limitis Intelligibility: Intelligibility reduced Word: 75-100% accurate (secondary to hypophonia) Phrase: 75-100% accurate Sentence: 75-100% accurate Conversation: 75-100% accurate Motor Planning: Witnin functional limits Interfering Components: Premorbid status Effective Techniques: Increased vocal intensity           Mark Riggs, M.S., [facility] [phone] ([place])  Mark Riggs 02/23/2021, 10:02 AM

## 2021-02-23 NOTE — Assessment & Plan Note (Addendum)
-   Hemoglobin A1c 6.0  (02/23/2021) -Patient's oral hypoglycemic agents were held during the hospitalization and patient maintained on sliding scale insulin.  -Oral hypoglycemic agents will be resumed on discharge.

## 2021-02-23 NOTE — Progress Notes (Signed)
PROGRESS NOTE    BRENNAN LITZINGER  ZRA:[id number] DOB: 12/29/1940 DOA: 02/22/2021 PCP: Albina Billet, MD    Chief Complaint  Patient presents with   Weakness    Brief Narrative:  Patient is a 81 year old gentleman history of Parkinson's disease, diabetes, hypertension presented to the ED for left-sided weakness and numbness as well as left facial droop started on the night of 215 around 9:30 PM lasted about 15 minutes and then subsequently resolved.  Associated with confusion beyond baseline.  Patient admitted for TIA work-up.  Neurology consulted.    Assessment & Plan:   Assessment and Plan: * TIA (transient ischemic attack) -Patient did present with left-sided weakness and numbness, left facial droop lasting about 15 minutes which resolved with confusion beyond baseline.   -Confusion improved currently close to baseline.   -CT head negative for any acute abnormalities.   -MRI brain negative for any acute abnormalities.   -Patient currently asymptomatic with resolution of facial droop, resolution of left-sided weakness and numbness.   -Check carotid Dopplers, 2D echo.   -Patient received aspirin 325 in the ED - Continue Zocor 40, daily aspirin Allow permissive hypertension so will continue to hold lisinopril.  -IV fluids normal saline 75 cc an hour for the next 24 hours. -Consult with neurology for further evaluation and management.  Parkinson's disease ([place]) - Continue home regimen of Sinemet and ropinirole.  BPH (benign prostatic hyperplasia) - Continue Flomax  Hypertension - Permissive hypertension for the next 24 hours due to concerns for TIA.   -Hold antihypertensive medications.   Diabetes mellitus without complication ([place]) - Hemoglobin A1c 6.0  (02/23/2021) -Hold home regimen oral hypoglycemic agents.   -SSI.           DVT prophylaxis: Lovenox Code Status: Full Family Communication: Updated patient and wife at bedside. Disposition:   Status is:  Observation The patient remains OBS appropriate and will d/c before 2 midnights.         Consultants:  Neurology: Dr. Theda Sers 02/23/2021  Procedures:  CT head 02/22/2021 Chest x-ray 02/22/2021 MRI brain 02/23/2021 Carotid ultrasound pending. 2D echo pending.     Antimicrobials:  None   Subjective: Feeling ok. Tremors noted, patient asking for his Parkinson's medications now.  No chest pain.  No shortness of breath.  No abdominal pain.  Per wife speech abnormalities and facial droop resolved on presentation to the ED.  Overall feeling much better than he did on presentation.  Objective: Vitals:   02/23/21 0700 02/23/21 0730 02/23/21 1120 02/23/21 1400  BP: 129/67 (!) 146/74 133/75 (!) 156/67  Pulse: (!) 59 61 73 71  Resp:  11 (!) 22 16  Temp:    97.6 F (36.4 C)  TempSrc:    Oral  SpO2: 98% 97% 96% 100%  Weight:      Height:       No intake or output data in the 24 hours ending 02/23/21 1459 Filed Weights   02/23/21 0318  Weight: 81.6 kg    Examination:  General exam: Appears calm and comfortable.  Tremors noted. Respiratory system: Clear to auscultation. Respiratory effort normal. Cardiovascular system: S1 & S2 heard, RRR. No JVD, murmurs, rubs, gallops or clicks. No pedal edema. Gastrointestinal system: Abdomen is nondistended, soft and nontender. No organomegaly or masses felt. Normal bowel sounds heard. Central nervous system: Alert and oriented.  No facial droop noted.  Sensation intact. CN 2-12 grossly intact.  5/5 bilateral upper extremity strength.  5/5 bilateral lower extremity strength.  Gait not tested secondary to safety.  No focal neurological deficits. Extremities: Symmetric 5 x 5 power. Skin: No rashes, lesions or ulcers Psychiatry: Judgement and insight appear normal. Mood & affect appropriate.     Data Reviewed:   CBC: Recent Labs  Lab 02/23/21 0014  WBC 7.6  NEUTROABS 5.4  HGB 13.2  HCT 37.8*  MCV 87.5  PLT 751    Basic Metabolic  Panel: Recent Labs  Lab 02/22/21 2243  NA 135  K 4.2  CL 102  CO2 25  GLUCOSE 163*  BUN 19  CREATININE 1.12  CALCIUM 9.4    GFR: Estimated Creatinine Clearance: 53.8 mL/min (by C-G formula based on SCr of 1.12 mg/dL).  Liver Function Tests: Recent Labs  Lab 02/22/21 2243  AST 18  ALT 7  ALKPHOS 97  BILITOT 1.2  PROT 7.3  ALBUMIN 4.7    CBG: Recent Labs  Lab 02/23/21 0331 02/23/21 0728 02/23/21 1330  GLUCAP 154* 132* 180*     Recent Results (from the past 240 hour(s))  Resp Panel by RT-PCR (Flu A&B, Covid) Nasopharyngeal Swab     Status: None   Collection Time: 02/22/21 11:43 PM   Specimen: Nasopharyngeal Swab; Nasopharyngeal(NP) swabs in vial transport medium  Result Value Ref Range Status   SARS Coronavirus 2 by RT PCR NEGATIVE NEGATIVE Final    Comment: (NOTE) SARS-CoV-2 target nucleic acids are NOT DETECTED.  The SARS-CoV-2 RNA is generally detectable in upper respiratory specimens during the acute phase of infection. The lowest concentration of SARS-CoV-2 viral copies this assay can detect is 138 copies/mL. A negative result does not preclude SARS-Cov-2 infection and should not be used as the sole basis for treatment or other patient management decisions. A negative result may occur with  improper specimen collection/handling, submission of specimen other than nasopharyngeal swab, presence of viral mutation(s) within the areas targeted by this assay, and inadequate number of viral copies(<138 copies/mL). A negative result must be combined with clinical observations, patient history, and epidemiological information. The expected result is Negative.  Fact Sheet for Patients:  EntrepreneurPulse.com.au  Fact Sheet for Healthcare Providers:  IncredibleEmployment.be  This test is no t yet approved or cleared by the [place] FDA and  has been authorized for detection and/or diagnosis of SARS-CoV-2 by FDA under  an Emergency Use Authorization (EUA). This EUA will remain  in effect (meaning this test can be used) for the duration of the COVID-19 declaration under Section 564(b)(1) of the Act, 21 U.S.C.section 360bbb-3(b)(1), unless the authorization is terminated  or revoked sooner.       Influenza A by PCR NEGATIVE NEGATIVE Final   Influenza B by PCR NEGATIVE NEGATIVE Final    Comment: (NOTE) The Xpert Xpress SARS-CoV-2/FLU/RSV plus assay is intended as an aid in the diagnosis of influenza from Nasopharyngeal swab specimens and should not be used as a sole basis for treatment. Nasal washings and aspirates are unacceptable for Xpert Xpress SARS-CoV-2/FLU/RSV testing.  Fact Sheet for Patients: EntrepreneurPulse.com.au  Fact Sheet for Healthcare Providers: IncredibleEmployment.be  This test is not yet approved or cleared by the [place] FDA and has been authorized for detection and/or diagnosis of SARS-CoV-2 by FDA under an Emergency Use Authorization (EUA). This EUA will remain in effect (meaning this test can be used) for the duration of the COVID-19 declaration under Section 564(b)(1) of the Act, 21 U.S.C. section 360bbb-3(b)(1), unless the authorization is terminated or revoked.  Performed at [facility], [address] [place],  [place] [address]   Culture, blood (routine x 2)     Status: None (Preliminary result)   Collection Time: 02/22/21 11:44 PM   Specimen: BLOOD  Result Value Ref Range Status   Specimen Description BLOOD LEFT FOREARM  Final   Special Requests   Final    BOTTLES DRAWN AEROBIC AND ANAEROBIC Blood Culture adequate volume   Culture   Final    NO GROWTH < 12 HOURS Performed at [facility], [address], [place], [place] [address]    Report Status PENDING  Incomplete  Culture, blood (Routine X 2) w Reflex to ID Panel     Status: None (Preliminary result)   Collection Time: 02/23/21  7:17 AM    Specimen: BLOOD  Result Value Ref Range Status   Specimen Description BLOOD BLOOD RIGHT HAND  Final   Special Requests   Final    BOTTLES DRAWN AEROBIC AND ANAEROBIC Blood Culture adequate volume   Culture   Final    NO GROWTH <12 HOURS Performed at [facility], [address] [place], [place] [address]    Report Status PENDING  Incomplete         Radiology Studies: CT HEAD WO CONTRAST  Result Date: 02/22/2021 CLINICAL DATA:  Mental status change left-sided weakness EXAM: CT HEAD WITHOUT CONTRAST TECHNIQUE: Contiguous axial images were obtained from the base of the skull through the vertex without intravenous contrast. RADIATION DOSE REDUCTION: This exam was performed according to the departmental dose-optimization program which includes automated exposure control, adjustment of the mA and/or kV according to patient size and/or use of iterative reconstruction technique. COMPARISON:  MRI brain 03/10/2018, CT brain 04/29/2017 FINDINGS: Brain: No acute territorial infarction, hemorrhage, or intracranial mass. Mild atrophy. Moderate hypodensity in the white matter consistent with chronic small vessel ischemic change. Stable ventricle size. Vascular: No hyperdense vessels.  Carotid vascular calcification Skull: Normal. Negative for fracture or focal lesion. Sinuses/Orbits: No acute finding. Other: None IMPRESSION: 1. No CT evidence for acute intracranial abnormality. 2. Atrophy and chronic small vessel ischemic changes of the white matter. Electronically Signed   By: Donavan Foil M.D.   On: 02/22/2021 23:42   MR BRAIN WO CONTRAST  Result Date: 02/23/2021 CLINICAL DATA:  Transient ischemic attack EXAM: MRI HEAD WITHOUT CONTRAST TECHNIQUE: Multiplanar, multiecho pulse sequences of the brain and surrounding structures were obtained without intravenous contrast. COMPARISON:  03/10/2018 FINDINGS: Brain: No acute infarct, mass effect or extra-axial collection. Unchanged chronic  microhemorrhage in the left cerebellum. There is multifocal hyperintense T2-weighted signal within the white matter. Generalized volume loss without a clear lobar predilection. The midline structures are normal. Vascular: Major flow voids are preserved. Skull and upper cervical spine: Normal calvarium and skull base. Visualized upper cervical spine and soft tissues are normal. Sinuses/Orbits:No paranasal sinus fluid levels or advanced mucosal thickening. No mastoid or middle ear effusion. Normal orbits. IMPRESSION: 1. No acute intracranial abnormality. 2. Findings of chronic ischemic microangiopathy and generalized volume loss. Electronically Signed   By: Ulyses Jarred M.D.   On: 02/23/2021 03:24   DG Chest Port 1 View  Result Date: 02/22/2021 CLINICAL DATA:  Weakness EXAM: PORTABLE CHEST 1 VIEW COMPARISON:  04/29/2017 FINDINGS: The heart size and mediastinal contours are within normal limits. Both lungs are clear. Aortic atherosclerosis. The visualized skeletal structures are unremarkable. IMPRESSION: No active disease. Electronically Signed   By: Donavan Foil M.D.   On: 02/22/2021 23:35        Scheduled Meds:  stroke: mapping our early stages of recovery book   Does not apply Once   carbidopa-levodopa  1 tablet Oral QHS   carbidopa-levodopa  2 tablet Oral QID   cholecalciferol  1,000 Units Oral Daily   enoxaparin (LOVENOX) injection  40 mg Subcutaneous Q24H   insulin aspart  0-15 Units Subcutaneous TID WC   insulin aspart  0-5 Units Subcutaneous QHS   rOPINIRole  0.25 mg Oral QID   simvastatin  40 mg Oral Daily   tamsulosin  0.4 mg Oral QPC supper   vitamin B-12  1,000 mcg Oral Daily   Continuous Infusions:  sodium chloride 75 mL/hr at 02/23/21 1415     LOS: 0 days    Time spent: 40 minutes  No charge.    Irine Seal, MD [facility]   To contact the attending provider between 7A-7P or the covering provider during after hours 7P-7A, please log into the web site  [email or web address] and access using universal [place] password for that web site. If you do not have the password, please call the hospital operator.  02/23/2021, 2:59 PM

## 2021-02-23 NOTE — ED Notes (Signed)
Patient transported to MRI 

## 2021-02-23 NOTE — Hospital Course (Signed)
Patient is a 81 year old gentleman history of Parkinson's disease, diabetes, hypertension presented to the ED for left-sided weakness and numbness as well as left facial droop started on the night of 215 around 9:30 PM lasted about 15 minutes and then subsequently resolved.  Associated with confusion beyond baseline.  Patient admitted for TIA work-up.  Neurology consulted.

## 2021-02-23 NOTE — Assessment & Plan Note (Addendum)
-   Patient maintained on home regimen of Sinemet and ropinirole.

## 2021-02-23 NOTE — H&P (Signed)
History and Physical    Patient: Mark Riggs PFX:[id number] DOB: 02-08-1940 DOA: 02/22/2021 DOS: the patient was seen and examined on 02/23/2021 PCP: Albina Billet, MD  Patient coming from: Home  Chief Complaint:  Chief Complaint  Patient presents with   Weakness    HPI: Mark Riggs is a 81 y.o. male with medical history significant of Parkinson's disease, DM, HTN who presents to the ED by EMS for evaluation of left-sided weakness and numbness including left facial droop that started on the night of 2/15 at around 9:30 PM lasting 15 minutes then completely resolving.  It was associated by confusion beyond baseline.  Patient was previously in his usual state of health on the night recent illness such as fever, cough, shortness of breath, nausea, vomiting, diarrhea or abdominal pain  ED course: On arrival blood pressure 182/86 with otherwise normal vitals Blood work for the most part unremarkable COVID and flu negative, urinalysis negative UDS positive for benzodiazepines EKG: Sinus at 75 with nonspecific ST-T wave changes CT head with no acute intracranial abnormality  Code stroke was not called due to complete resolution of patient's symptoms by arrival and NIH of 0 Hospitalist consulted for admission.   Review of Systems: As mentioned in the history of present illness. All other systems reviewed and are negative. Past Medical History:  Diagnosis Date   BPH (benign prostatic hyperplasia)    Diabetes mellitus without complication ([place])    Hyperlipemia    Hypertension    Renal cancer ([place]) 2007   left   Renal mass    Past Surgical History:  Procedure Laterality Date   APPENDECTOMY     LUMBAR LAMINECTOMY/ DECOMPRESSION WITH MET-RX N/A 09/22/2018   Procedure: L3-4 LAMINECTOMY;  Surgeon: Deetta Perla, MD;  Location: [facility] ORS;  Service: Neurosurgery;  Laterality: N/A;   RENAL MASS EXCISION  2008   TONSILLECTOMY     TRANSURETHRAL RESECTION OF PROSTATE     patient unsure  about this surgery long time ago by Dr. Ernst Spell   Social History:  reports that he has quit smoking. He has never used smokeless tobacco. He reports that he does not drink alcohol and does not use drugs.  Allergies  Allergen Reactions   Contrast Media [Iodinated Contrast Media] Swelling   Sulfa Antibiotics Swelling   Tetracyclines & Related Other (See Comments)    unknown    No family history on file.  Prior to Admission medications   Medication Sig Start Date End Date Taking? Authorizing Provider  ALPRAZolam Duanne Moron) 1 MG tablet Take 1 mg by mouth 3 (three) times daily as needed for anxiety.   Yes [provider]  carbidopa-levodopa (SINEMET CR) 50-200 MG tablet Take 1 tablet by mouth at bedtime. 02/02/21  Yes [provider]  carbidopa-levodopa (SINEMET IR) 25-100 MG tablet Take 2 tablets by mouth 3 (three) times daily.  07/07/18  Yes [provider]  cholecalciferol (VITAMIN D3) 25 MCG (1000 UT) tablet Take 1,000 Units by mouth daily.   Yes [provider]  glipiZIDE (GLUCOTROL XL) 2.5 MG 24 hr tablet Take 2.5 mg by mouth daily with breakfast. 11/20/20  Yes [provider]  lisinopril (ZESTRIL) 2.5 MG tablet Take 2.5 mg by mouth daily.    Yes [provider]  metFORMIN (GLUCOPHAGE) 500 MG tablet Take 500 mg by mouth daily with breakfast. 11/20/20  Yes [provider]  rOPINIRole (REQUIP) 0.25 MG tablet Take 0.25 mg by mouth in the morning, at noon, in the  evening, and at bedtime. 11/20/20  Yes [provider]  simvastatin (ZOCOR) 40 MG tablet Take 40 mg by mouth daily.   Yes [provider]  tamsulosin (FLOMAX) 0.4 MG CAPS capsule Take 0.4 mg by mouth daily after supper. 12/26/20  Yes [provider]  vitamin B-12 (CYANOCOBALAMIN) 1000 MCG tablet Take 1,000 mcg by mouth daily.   Yes [provider]    Physical Exam: Vitals:   02/22/21 2241 02/23/21 0030  BP: (!) 182/86 (!) 157/66  Pulse:  75 64  Resp: 16 16  Temp: 97.6 F (36.4 C)   TempSrc: Oral   SpO2: 100% 98%   Physical Exam Vitals and nursing note reviewed.  Constitutional:      General: He is not in acute distress.    Appearance: Normal appearance.  HENT:     Head: Normocephalic and atraumatic.  Cardiovascular:     Rate and Rhythm: Normal rate and regular rhythm.     Pulses: Normal pulses.     Heart sounds: Normal heart sounds. No murmur heard. Pulmonary:     Effort: Pulmonary effort is normal.     Breath sounds: Normal breath sounds. No wheezing or rhonchi.  Abdominal:     General: Bowel sounds are normal.     Palpations: Abdomen is soft.     Tenderness: There is no abdominal tenderness.  Musculoskeletal:        General: No swelling or tenderness. Normal range of motion.     Cervical back: Normal range of motion and neck supple.  Skin:    General: Skin is warm and dry.  Neurological:     General: No focal deficit present.     Mental Status: He is alert. Mental status is at baseline.     Comments: Parkinsonian tremor  Psychiatric:        Mood and Affect: Mood normal.        Behavior: Behavior normal.     Data Reviewed: Notes from primary care and specialist visits, past discharge summaries. Prior diagnostic testing as applicable to current admission diagnoses Updated medications and problem lists for reconciliation ED course, including vitals, labs, imaging, treatment and response to treatment Triage notes and ED providers notes   Assessment and Plan: * TIA (transient ischemic attack) Patient received aspirin 325 in the ED Continue Zocor 40, daily aspirin Allow permissive hypertension so will hold lisinopril Euglycemia and euthymia Neurologic checks Follow-up MRI, carotid Doppler Consider neurology consult in the a.m.  Parkinson's disease ([place]) Resume home Sinemet and ropinirole  BPH (benign prostatic hyperplasia) Continue tamsulosin  Hypertension Hold home antihypertensives for  permissive hypertension for 24 hours  Diabetes mellitus without complication ([place]) Sliding scale insulin coverage       Advance Care Planning:   Code Status: Full Code   Consults: none  Family Communication: none  Severity of Illness: The appropriate patient status for this patient is OBSERVATION. Observation status is judged to be reasonable and necessary in order to provide the required intensity of service to ensure the patient's safety. The patient's presenting symptoms, physical exam findings, and initial radiographic and laboratory data in the context of their medical condition is felt to place them at decreased risk for further clinical deterioration. Furthermore, it is anticipated that the patient will be medically stable for discharge from the hospital within 2 midnights of admission.   Author: Athena Masse, MD 02/23/2021 1:46 AM  For on call review [email or web address].

## 2021-02-23 NOTE — Consult Note (Addendum)
Neurology stroke consult H&P  Mark Riggs MR# [id number] 02/23/2021   CC: left sided weakness  History is obtained from: patient and chart.  HPI: Mark Riggs is a 81 y.o. male PMHx as reviewed below developed transient numbness and left sided weakness ~2130 which lasted about 15 minutes and completely resolved PTA.  The following information was taken from the hospitalist note 02/23/2021:  "Parkinson's disease, DM, HTN who presents to the ED by EMS for evaluation of left-sided weakness and numbness including left facial droop that started on the night of 2/15 at around 9:30 PM lasting 15 minutes then completely resolving.  It was associated by confusion beyond baseline.  Patient was previously in his usual state of health on the night recent illness such as fever, cough, shortness of breath, nausea, vomiting, diarrhea or abdominal pain   ED course: On arrival blood pressure 182/86 with otherwise normal vitals Blood work for the most part unremarkable COVID and flu negative, urinalysis negative UDS positive for benzodiazepines EKG: Sinus at 75 with nonspecific ST-T wave changes CT head with no acute intracranial abnormality   Code stroke was not called due to complete resolution of patient's symptoms by arrival and NIH of 0 Hospitalist consulted for admission."  LKW: 2130 tNK given: No complete resolution IR Thrombectomy Not a candidate Modified Rankin Scale: 0-Completely asymptomatic and back to baseline post- stroke NIHSS: 0  ROS: A complete ROS was performed and is negative except as noted in the HPI.   Past Medical History:  Diagnosis Date   BPH (benign prostatic hyperplasia)    Diabetes mellitus without complication ([place])    Hyperlipemia    Hypertension    Renal cancer ([place]) 2007   left   Renal mass    Social History:  reports that he has quit smoking. He has never used smokeless tobacco. He reports that he does not drink alcohol and does not use  drugs.  Prior to Admission medications   Medication Sig Start Date End Date Taking? Authorizing Provider  ALPRAZolam Duanne Moron) 1 MG tablet Take 1 mg by mouth 3 (three) times daily as needed for anxiety.   Yes [provider]  carbidopa-levodopa (SINEMET CR) 50-200 MG tablet Take 1 tablet by mouth at bedtime. 02/02/21  Yes [provider]  carbidopa-levodopa (SINEMET IR) 25-100 MG tablet Take 2 tablets by mouth 3 (three) times daily.  07/07/18  Yes [provider]  cholecalciferol (VITAMIN D3) 25 MCG (1000 UT) tablet Take 1,000 Units by mouth daily.   Yes [provider]  glipiZIDE (GLUCOTROL XL) 2.5 MG 24 hr tablet Take 2.5 mg by mouth daily with breakfast. 11/20/20  Yes [provider]  lisinopril (ZESTRIL) 2.5 MG tablet Take 2.5 mg by mouth daily.    Yes [provider]  metFORMIN (GLUCOPHAGE) 500 MG tablet Take 500 mg by mouth daily with breakfast. 11/20/20  Yes [provider]  rOPINIRole (REQUIP) 0.25 MG tablet Take 0.25 mg by mouth in the morning, at noon, in the evening, and at bedtime. 11/20/20  Yes [provider]  simvastatin (ZOCOR) 40 MG tablet Take 40 mg by mouth daily.   Yes [provider]  tamsulosin (FLOMAX) 0.4 MG CAPS capsule Take 0.4 mg by mouth daily after supper. 12/26/20  Yes [provider]  vitamin B-12 (CYANOCOBALAMIN) 1000 MCG tablet Take 1,000 mcg by mouth daily.   Yes [provider]   Exam: Current vital signs: BP (!) 146/74    Pulse 61  Temp 97.6 F (36.4 C) (Oral)    Resp 11    Ht 5\' 7"  (1.702 m)    Wt 81.6 kg    SpO2 97%    BMI 28.19 kg/m   Physical Exam  Constitutional: Appears well-developed and well-nourished.  Psych: Affect appropriate to situation Eyes: No scleral injection HENT: No OP obstruction. Head: Normocephalic.  Cardiovascular: Normal rate and regular rhythm.  Respiratory: Effort normal, symmetric excursions bilaterally, no audible wheezing. GI:  Soft.  No distension. There is no tenderness.  Skin: WDI  Neuro: Mental Status: Patient is awake, alert, oriented to person, place, month, year, and situation. Patient is able to give a clear and coherent history. Speech relatively fluent, intact comprehension and repetition. No signs of aphasia or neglect. Visual Fields are full. Pupils are equal, round, and reactive to light. EOMI without ptosis or diplopia.  Facial sensation is symmetric to temperature Facial movement is symmetric.  Hearing is intact to voice. Uvula midline and palate elevates symmetrically. Shoulder shrug is symmetric. Tongue is midline without atrophy or fasciculations.  Tone is increased. Bulk is normal. 5/5 strength was present in all four extremities. Sensation is symmetric to light touch and temperature in the arms and legs. Bradykinesia in upper extremities with significant resting tremor in left hand.  He has cog wheel rigidity in all extremities with left>>right. Deep Tendon Reflexes: 2+ and symmetric in the biceps and patellae. Toes are downgoing bilaterally. FNF and HKS are intact bilaterally. Gait - Deferred  I have reviewed labs in epic and the pertinent results are: Lab Results  Component Value Date   HGBA1C 5.6 12/01/2017   and  Lab Results  Component Value Date   [place] 52 02/23/2021   I have reviewed the images obtained: MRI brain showed no acute infarct, mass effect or extra-axial collection. Unchanged chronic microhemorrhage in the left cerebellum. There is multifocal hyperintense T2-weighted signal within the white matter.  Echocardiogram read as left ventricular EF 60 - 65%. The left ventricle has no regional wall motion abnormalities. No shunt or mention of LVT.  Assessment: Mark Riggs is a 81 y.o. male PMHx as noted above, DM 2, HTN, HLD, Parkinson's with transient left sided weakness which resolved prior to arrival. He does have vascular risk factors and will need TIA workup.  Unfortunately, his tremor may significantly degrade MAR and due to iodine contrast allergy he will need premedication prior to CTA head.  Impression:  TIA - Transient left sided weakness HTN HLD DM 2  Plan: - Carotid Doppler bilateral - Pending. - Continue statin. - Continue aspirin 81mg  daily. - Continue clopidogrel 75mg  daily for 3 weeks. - BP goal <140/90. - Telemetry monitoring for arrhythmia. - Stroke education. - Continue PT/OT/SLP.  Electronically signed by:  Lynnae Sandhoff, MD Page: [phone] 02/23/2021, 8:18 AM  If 7pm- 7am, please page neurology on call as listed in [place].

## 2021-02-23 NOTE — Assessment & Plan Note (Addendum)
Continue flomax  

## 2021-02-23 NOTE — Assessment & Plan Note (Addendum)
-   Permissive hypertension initially during the hospitalization.  -Patient's antihypertensive medications were held and will be resumed 2 to 3 days post discharge.   -Blood pressure remained stable with systolic blood pressure <459.   -Outpatient follow-up with PCP.

## 2021-02-23 NOTE — Evaluation (Signed)
Occupational Therapy Evaluation Patient Details Name: Mark Riggs MRN: [id number] DOB: [birth year] Today's Date: 02/23/2021   History of Present Illness presented to ER secondary to L-sided weakness, numbness, L facial droop; admitted for TIA/CVA work up.  MRI negative for acute infarct.   Clinical Impression   Patient presenting with decreased Ind in self care, balance, functional mobility/transfers, endurance, and safety awareness. Patient reports being mod I at baseline with RW and lives with wife. Pt does have parkinson's and has shuffled gait and mild B UE tremors at baseline. He has not had parkinson medication at time of this assessment. Pt transitioned from PT evaluation to OT evaluation during toileting task. Pt able to perform clothing management and hygiene with min guard - min A for balance with standing tasks. Pt with 2 anterior LOB when in tight spaces and attempting to turn around.Pt ambulates back to room with min guard and use of RW.  Patient will benefit from acute OT to increase overall independence in the areas of ADLs, functional mobility, and safety awareness in order to safely discharge home with family.      Recommendations for follow up therapy are one component of a multi-disciplinary discharge planning process, led by the attending physician.  Recommendations may be updated based on patient status, additional functional criteria and insurance authorization.   Follow Up Recommendations  Home health OT    Assistance Recommended at Discharge Intermittent Supervision/Assistance  Patient can return home with the following A little help with walking and/or transfers;A little help with bathing/dressing/bathroom;Help with stairs or ramp for entrance;Assist for transportation;Assistance with cooking/housework    Functional Status Assessment  Patient has had a recent decline in their functional status and demonstrates the ability to make significant improvements in function  in a reasonable and predictable amount of time.  Equipment Recommendations  None recommended by OT       Precautions / Restrictions Precautions Precautions: Fall Restrictions Weight Bearing Restrictions: No      Mobility Bed Mobility Overal bed mobility: Needs Assistance Bed Mobility: Sit to Supine       Sit to supine: Min guard   General bed mobility comments: for safety with balance while trying to get onto high ED stretcher    Transfers Overall transfer level: Needs assistance Equipment used: None Transfers: Sit to/from Stand Sit to Stand: Min guard, Min assist                  Balance Overall balance assessment: Needs assistance Sitting-balance support: No upper extremity supported, Feet supported Sitting balance-Leahy Scale: Good     Standing balance support: Bilateral upper extremity supported Standing balance-Leahy Scale: Fair                             ADL either performed or assessed with clinical judgement   ADL Overall ADL's : Needs assistance/impaired     Grooming: Wash/dry hands;Wash/dry face;Standing;Min guard                   Toilet Transfer: Minimal assistance;Ambulation;Regular Museum/gallery exhibitions officer and Hygiene: Min guard;Sit to/from stand       Functional mobility during ADLs: Min guard;Minimal assistance;Rolling walker (2 wheels)       Vision Patient Visual Report: No change from baseline              Pertinent Vitals/Pain Pain Assessment Pain Assessment: No/denies pain     Hand Dominance Right  Extremity/Trunk Assessment Upper Extremity Assessment Upper Extremity Assessment: Generalized weakness;Overall WFL for tasks assessed (4/5 throughout with baseline tremors)   Lower Extremity Assessment Lower Extremity Assessment: Overall WFL for tasks assessed;Generalized weakness       Communication Communication Communication: No difficulties   Cognition Arousal/Alertness:  Awake/alert Behavior During Therapy: WFL for tasks assessed/performed Overall Cognitive Status: Within Functional Limits for tasks assessed                                                  Home Living Family/patient expects to be discharged to:: Private residence Living Arrangements: Spouse/significant other Available Help at Discharge: Family;Available 24 hours/day Type of Home: House Home Access: Stairs to enter [employer] of Steps: 1 Entrance Stairs-Rails: None Home Layout: One level     Bathroom Shower/Tub: Occupational psychologist: Standard     Home Equipment: Conservation officer, nature (2 wheels)      Lives With: Spouse    Prior Functioning/Environment Prior Level of Function : Independent/Modified Independent             Mobility Comments: Indep with ADLs, household and community mobilization without assist device; denies fall history.          OT Problem List: Decreased strength;Decreased activity tolerance;Impaired balance (sitting and/or standing);Decreased safety awareness      OT Treatment/Interventions: Self-care/ADL training;Therapeutic exercise;Therapeutic activities;DME and/or AE instruction;Manual therapy;Balance training;Patient/family education;Energy conservation    OT Goals(Current goals can be found in the care plan section) Acute Rehab OT Goals Patient Stated Goal: to get stronger an go home OT Goal Formulation: With patient/family Time For Goal Achievement: 03/09/21 Potential to Achieve Goals: Fair ADL Goals Pt Will Perform Grooming: with modified independence;standing Pt Will Perform Lower Body Dressing: with modified independence;sit to/from stand Pt Will Transfer to Toilet: with modified independence;ambulating Pt Will Perform Toileting - Clothing Manipulation and hygiene: with modified independence;sit to/from stand  OT Frequency: Min 2X/week       AM-PAC OT "6 Clicks" Daily Activity     Outcome  Measure Help from another person eating meals?: None Help from another person taking care of personal grooming?: A Little Help from another person toileting, which includes using toliet, bedpan, or urinal?: A Little Help from another person bathing (including washing, rinsing, drying)?: A Little Help from another person to put on and taking off regular upper body clothing?: None Help from another person to put on and taking off regular lower body clothing?: A Little 6 Click Score: 20   End of Session Equipment Utilized During Treatment: Rolling walker (2 wheels) Nurse Communication: Mobility status  Activity Tolerance: Patient tolerated treatment well Patient left: in bed;with call bell/phone within reach;with bed alarm set  OT Visit Diagnosis: Unsteadiness on feet (R26.81);Muscle weakness (generalized) (M62.81)                Time: 2025-4270 OT Time Calculation (min): 17 min Charges:  OT General Charges $OT Visit: 1 Visit OT Evaluation $OT Eval Moderate Complexity: 1 Mod OT Treatments $Self Care/Home Management : 8-22 mins  Darleen Crocker, MS, OTR/L , CBIS ascom [phone]  02/23/21, 2:56 PM

## 2021-02-24 DIAGNOSIS — G459 Transient cerebral ischemic attack, unspecified: Secondary | ICD-10-CM | POA: Diagnosis not present

## 2021-02-24 DIAGNOSIS — G20 Parkinson's disease: Secondary | ICD-10-CM | POA: Diagnosis not present

## 2021-02-24 DIAGNOSIS — E119 Type 2 diabetes mellitus without complications: Secondary | ICD-10-CM | POA: Diagnosis not present

## 2021-02-24 DIAGNOSIS — I10 Essential (primary) hypertension: Secondary | ICD-10-CM | POA: Diagnosis not present

## 2021-02-24 DIAGNOSIS — N400 Enlarged prostate without lower urinary tract symptoms: Secondary | ICD-10-CM | POA: Diagnosis not present

## 2021-02-24 LAB — BASIC METABOLIC PANEL
Anion gap: 7 (ref 5–15)
BUN: 14 mg/dL (ref 8–23)
CO2: 23 mmol/L (ref 22–32)
Calcium: 8.8 mg/dL — ABNORMAL LOW (ref 8.9–10.3)
Chloride: 108 mmol/L (ref 98–111)
Creatinine, Ser: 0.9 mg/dL (ref 0.61–1.24)
GFR, Estimated: >60 mL/min (ref >60)
Glucose, Bld: 157 mg/dL — ABNORMAL HIGH (ref 70–99)
Potassium: 3.6 mmol/L (ref 3.5–5.1)
Sodium: 138 mmol/L (ref 135–145)

## 2021-02-24 LAB — URINE CULTURE: Culture: NO GROWTH

## 2021-02-24 LAB — GLUCOSE, CAPILLARY
Glucose-Capillary: 171 mg/dL — ABNORMAL HIGH (ref 70–99)
Glucose-Capillary: 154 mg/dL — ABNORMAL HIGH (ref 70–99)

## 2021-02-24 MED ORDER — ASPIRIN 81 MG PO TBEC: 81.0000 mg | DELAYED_RELEASE_TABLET | Freq: Every day | ORAL | 11 refills | Status: DC

## 2021-02-24 MED ORDER — CLOPIDOGREL BISULFATE 75 MG PO TABS: 75.0000 mg | ORAL_TABLET | Freq: Every day | ORAL | 0 refills | Status: AC

## 2021-02-24 MED ORDER — CARBIDOPA-LEVODOPA 25-100 MG PO TABS
2.0000 | ORAL_TABLET | Freq: Four times a day (QID) | ORAL | Status: DC
  Administered 2021-02-24 (×2): 2 via ORAL
  Filled 2021-02-24 (×2): qty 2

## 2021-02-24 MED ORDER — LISINOPRIL 2.5 MG PO TABS: 2.5000 mg | ORAL_TABLET | Freq: Every day | ORAL | Status: DC

## 2021-02-24 MED ORDER — CLOPIDOGREL BISULFATE 75 MG PO TABS: 75.0000 mg | ORAL_TABLET | Freq: Every day | ORAL | 0 refills | Status: DC

## 2021-02-24 MED ORDER — ROPINIROLE HCL 0.25 MG PO TABS
0.2500 mg | ORAL_TABLET | Freq: Four times a day (QID) | ORAL | Status: DC
  Administered 2021-02-24 (×2): 0.25 mg via ORAL
  Filled 2021-02-24 (×4): qty 1

## 2021-02-24 NOTE — Discharge Summary (Signed)
Physician Discharge Summary  BEUFORD GARCILAZO ERX:[id number] DOB: January 04, 1941 DOA: 02/22/2021  PCP: Albina Billet, MD  Admit date: 02/22/2021 Discharge date: 02/24/2021  Time spent: 55 minutes  Recommendations for Outpatient Follow-up:  Follow-up with Albina Billet, MD in 2 weeks. Follow-up with Dr. Manuella Ghazi neurology in 3 weeks.   Discharge Diagnoses:  Principal Problem:   TIA (transient ischemic attack) Active Problems:   Diabetes mellitus without complication (HCC)   Hypertension   BPH (benign prostatic hyperplasia)   Parkinson's disease ([place])   Discharge Condition: Stable and improved  Diet recommendation: Carb modified diet  Filed Weights   02/23/21 0318  Weight: 81.6 kg    History of present illness:  HPI per Dr. Ervin Knack is a 81 y.o. male with medical history significant of Parkinson's disease, DM, HTN who presents to the ED by EMS for evaluation of left-sided weakness and numbness including left facial droop that started on the night of 2/15 at around 9:30 PM lasting 15 minutes then completely resolving.  It was associated by confusion beyond baseline.  Patient was previously in his usual state of health on the night recent illness such as fever, cough, shortness of breath, nausea, vomiting, diarrhea or abdominal pain   ED course: On arrival blood pressure 182/86 with otherwise normal vitals Blood work for the most part unremarkable COVID and flu negative, urinalysis negative UDS positive for benzodiazepines EKG: Sinus at 75 with nonspecific ST-T wave changes CT head with no acute intracranial abnormality   Code stroke was not called due to complete resolution of patient's symptoms by arrival and NIH of 0 Hospitalist consulted for admission.    Review of Systems: As mentioned in the history of present illness. All other systems reviewed and are negative. Hospital Course:   Assessment and Plan: * TIA (transient ischemic attack) -Patient did present  with left-sided weakness and numbness, left facial droop lasting about 15 minutes which resolved with confusion beyond baseline.   -Confusion improved and was back to baseline by day of discharge.  -CT head negative for any acute abnormalities.   -MRI brain negative for any acute abnormalities.   -Patient remained asymptomatic with resolution of facial droop, resolution of left-sided weakness and numbness.   -2D echo done with normal EF, no PFO was noted, no source of emboli noted.   -Carotid Dopplers done with no significant ICA stenosis.   -Patient received aspirin 325 mg on presentation to the ED, maintained on Zocor 40 mg daily.   -Patient seen by neurology who recommended aspirin 81 mg daily, Plavix 75 mg x 3 weeks and then aspirin only.   -Patient seen by PT/OT/ST.   -Outpatient follow-up with primary neurologist in 3 weeks.  .  Parkinson's disease ([place]) - Patient maintained on home regimen of Sinemet and ropinirole.  BPH (benign prostatic hyperplasia) - Patient maintained on home regimen of Flomax.    Hypertension - Permissive hypertension initially during the hospitalization.  -Patient's antihypertensive medications were held and will be resumed 2 to 3 days post discharge.   -Blood pressure remained stable with systolic blood pressure <950.   -Outpatient follow-up with PCP.   Diabetes mellitus without complication ([place]) - Hemoglobin A1c 6.0  (02/23/2021) -Patient's oral hypoglycemic agents were held during the hospitalization and patient maintained on sliding scale insulin.  -Oral hypoglycemic agents will be resumed on discharge.         Procedures: CT head 02/22/2021 Chest x-ray 02/22/2021 MRI brain 02/23/2021 Carotid ultrasound 02/23/2021 2D  echo 02/23/2021  Consultations: Neurology: Dr. Theda Sers 02/23/2021  Discharge Exam: Vitals:   02/24/21 0856 02/24/21 1159  BP: 110/62 129/71  Pulse: 64 (!) 56  Resp: 16 16  Temp: 97.7 F (36.5 C) 97.8 F (36.6 C)  SpO2: 98%  98%    General: NAD. Cardiovascular: Regular rate rhythm no murmurs rubs or gallops.  No JVD.  No lower extremity edema. Respiratory: Clear to auscultation bilaterally.  No wheezes, no crackles, no rhonchi.  Discharge Instructions   Discharge Instructions     Diet Carb Modified   Complete by: As directed    Increase activity slowly   Complete by: As directed       Allergies as of 02/24/2021       Reactions   Contrast Media [iodinated Contrast Media] Swelling   Sulfa Antibiotics Swelling   Tetracyclines & Related Other (See Comments)   unknown        Medication List     TAKE these medications    ALPRAZolam 1 MG tablet Commonly known as: XANAX Take 1 mg by mouth 3 (three) times daily as needed for anxiety.   aspirin 81 MG EC tablet Take 1 tablet (81 mg total) by mouth daily. Swallow whole. Start taking on: February 25, 2021   carbidopa-levodopa 50-200 MG tablet Commonly known as: SINEMET CR Take 1 tablet by mouth at bedtime. What changed: Another medication with the same name was removed. Continue taking this medication, and follow the directions you see here.   carbidopa-levodopa 25-100 MG tablet Commonly known as: SINEMET IR Take 2 tablets by mouth in the morning, at noon, in the evening, and at bedtime. What changed: Another medication with the same name was removed. Continue taking this medication, and follow the directions you see here.   cholecalciferol 25 MCG (1000 UNIT) tablet Commonly known as: VITAMIN D3 Take 1,000 Units by mouth daily.   clopidogrel 75 MG tablet Commonly known as: PLAVIX Take 1 tablet (75 mg total) by mouth daily for 21 days. Start taking on: February 25, 2021   glipiZIDE 2.5 MG 24 hr tablet Commonly known as: GLUCOTROL XL Take 2.5 mg by mouth daily with breakfast.   lisinopril 2.5 MG tablet Commonly known as: ZESTRIL Take 1 tablet (2.5 mg total) by mouth daily. Start taking on: February 26, 2021 What changed: These  instructions start on February 26, 2021. If you are unsure what to do until then, ask your doctor or other care provider.   metFORMIN 500 MG tablet Commonly known as: GLUCOPHAGE Take 500 mg by mouth daily with breakfast.   rOPINIRole 0.25 MG tablet Commonly known as: REQUIP Take 0.25 mg by mouth in the morning, at noon, in the evening, and at bedtime.   simvastatin 40 MG tablet Commonly known as: ZOCOR Take 40 mg by mouth daily.   tamsulosin 0.4 MG Caps capsule Commonly known as: FLOMAX Take 0.4 mg by mouth daily after supper.   vitamin B-12 1000 MCG tablet Commonly known as: CYANOCOBALAMIN Take 1,000 mcg by mouth daily.       Allergies  Allergen Reactions   Contrast Media [Iodinated Contrast Media] Swelling   Sulfa Antibiotics Swelling   Tetracyclines & Related Other (See Comments)    unknown    Follow-up Information     Albina Billet, MD. Schedule an appointment as soon as possible for a visit in 2 week(s).   Specialty: Internal Medicine Contact information: [address] [place]   [place] [address] [phone]  Vladimir Crofts, MD. Schedule an appointment as soon as possible for a visit in 3 week(s).   Specialty: Neurology Contact information: [place] [facility] [place] [address] [phone]                  The results of significant diagnostics from this hospitalization (including imaging, microbiology, ancillary and laboratory) are listed below for reference.    Significant Diagnostic Studies: CT HEAD WO CONTRAST  Result Date: 02/22/2021 CLINICAL DATA:  Mental status change left-sided weakness EXAM: CT HEAD WITHOUT CONTRAST TECHNIQUE: Contiguous axial images were obtained from the base of the skull through the vertex without intravenous contrast. RADIATION DOSE REDUCTION: This exam was performed according to the departmental dose-optimization program which includes automated exposure control,  adjustment of the mA and/or kV according to patient size and/or use of iterative reconstruction technique. COMPARISON:  MRI brain 03/10/2018, CT brain 04/29/2017 FINDINGS: Brain: No acute territorial infarction, hemorrhage, or intracranial mass. Mild atrophy. Moderate hypodensity in the white matter consistent with chronic small vessel ischemic change. Stable ventricle size. Vascular: No hyperdense vessels.  Carotid vascular calcification Skull: Normal. Negative for fracture or focal lesion. Sinuses/Orbits: No acute finding. Other: None IMPRESSION: 1. No CT evidence for acute intracranial abnormality. 2. Atrophy and chronic small vessel ischemic changes of the white matter. Electronically Signed   By: Donavan Foil M.D.   On: 02/22/2021 23:42   MR BRAIN WO CONTRAST  Result Date: 02/23/2021 CLINICAL DATA:  Transient ischemic attack EXAM: MRI HEAD WITHOUT CONTRAST TECHNIQUE: Multiplanar, multiecho pulse sequences of the brain and surrounding structures were obtained without intravenous contrast. COMPARISON:  03/10/2018 FINDINGS: Brain: No acute infarct, mass effect or extra-axial collection. Unchanged chronic microhemorrhage in the left cerebellum. There is multifocal hyperintense T2-weighted signal within the white matter. Generalized volume loss without a clear lobar predilection. The midline structures are normal. Vascular: Major flow voids are preserved. Skull and upper cervical spine: Normal calvarium and skull base. Visualized upper cervical spine and soft tissues are normal. Sinuses/Orbits:No paranasal sinus fluid levels or advanced mucosal thickening. No mastoid or middle ear effusion. Normal orbits. IMPRESSION: 1. No acute intracranial abnormality. 2. Findings of chronic ischemic microangiopathy and generalized volume loss. Electronically Signed   By: Ulyses Jarred M.D.   On: 02/23/2021 03:24   US Carotid Bilateral  Result Date: 02/24/2021 CLINICAL DATA:  Transient ischemic attack EXAM: BILATERAL  CAROTID DUPLEX ULTRASOUND TECHNIQUE: Pearline Cables scale imaging, color Doppler and duplex ultrasound were performed of bilateral carotid and vertebral arteries in the neck. COMPARISON:  None. FINDINGS: Criteria: Quantification of carotid stenosis is based on velocity parameters that correlate the residual internal carotid diameter with NASCET-based stenosis levels, using the diameter of the distal internal carotid lumen as the denominator for stenosis measurement. The following velocity measurements were obtained: RIGHT ICA: 85/19 cm/sec CCA: 62/83 cm/sec SYSTOLIC ICA/CCA RATIO:  0.9 ECA:  97 cm/sec LEFT ICA: 98/13 cm/sec CCA: 15/17 cm/sec SYSTOLIC ICA/CCA RATIO:  1.2 ECA:  58 cm/sec RIGHT CAROTID ARTERY: No significant atherosclerotic plaque or evidence of stenosis in the internal carotid artery. RIGHT VERTEBRAL ARTERY:  Patent with antegrade flow. LEFT CAROTID ARTERY: No significant atherosclerotic plaque or stenosis in the internal carotid artery. LEFT VERTEBRAL ARTERY:  Patent with normal antegrade flow. Other: Incidental note made of a small dystrophic calcification in the left thyroid gland. This is considered an incidental finding and warrants no further evaluation. IMPRESSION: 1. No significant atherosclerotic plaque or evidence of stenosis in either internal carotid  artery. 2. Vertebral arteries are patent with normal antegrade flow. Electronically Signed   By: Jacqulynn Cadet M.D.   On: 02/24/2021 06:03   DG Chest Port 1 View  Result Date: 02/22/2021 CLINICAL DATA:  Weakness EXAM: PORTABLE CHEST 1 VIEW COMPARISON:  04/29/2017 FINDINGS: The heart size and mediastinal contours are within normal limits. Both lungs are clear. Aortic atherosclerosis. The visualized skeletal structures are unremarkable. IMPRESSION: No active disease. Electronically Signed   By: Donavan Foil M.D.   On: 02/22/2021 23:35   ECHOCARDIOGRAM COMPLETE  Result Date: 02/23/2021    ECHOCARDIOGRAM REPORT   Patient Name:   NOTNAMED SCHOLZ  [place] Date of Exam: 02/23/2021 Medical Rec #:  [id number]        Height:       67.0 in Accession #:    [id number]       Weight:       180.0 lb Date of Birth:  12/31/1940        BSA:          1.934 m Patient Age:    81 years         BP:           133/75 mmHg Patient Gender: M                HR:           73 bpm. Exam Location:  [facility] Procedure: 2D Echo, Cardiac Doppler and Color Doppler Indications:     Stroke I63.9  History:         Patient has no prior history of Echocardiogram examinations.                  Risk Factors:Dyslipidemia, Hypertension and Diabetes.  Sonographer:     Sherrie Sport Referring Phys:  [id number] Athena Masse Diagnosing Phys: Serafina Royals MD  Sonographer Comments: Suboptimal apical window and suboptimal subcostal window. IMPRESSIONS  1. Left ventricular ejection fraction, by estimation, is 60 to 65%. The left ventricle has normal function. The left ventricle has no regional wall motion abnormalities. Left ventricular diastolic parameters were normal.  2. Right ventricular systolic function is normal. The right ventricular size is normal.  3. The mitral valve is normal in structure. Trivial mitral valve regurgitation.  4. The aortic valve is normal in structure. Aortic valve regurgitation is not visualized. FINDINGS  Left Ventricle: Left ventricular ejection fraction, by estimation, is 60 to 65%. The left ventricle has normal function. The left ventricle has no regional wall motion abnormalities. The left ventricular internal cavity size was normal in size. There is  no left ventricular hypertrophy. Left ventricular diastolic parameters were normal. Right Ventricle: The right ventricular size is normal. No increase in right ventricular wall thickness. Right ventricular systolic function is normal. Left Atrium: Left atrial size was normal in size. Right Atrium: Right atrial size was normal in size. Pericardium: There is no evidence of pericardial effusion. Mitral Valve: The mitral valve is normal  in structure. Trivial mitral valve regurgitation. MV peak gradient, 6.0 mmHg. The mean mitral valve gradient is 2.0 mmHg. Tricuspid Valve: The tricuspid valve is normal in structure. Tricuspid valve regurgitation is trivial. Aortic Valve: The aortic valve is normal in structure. Aortic valve regurgitation is not visualized. Aortic valve mean gradient measures 4.0 mmHg. Aortic valve peak gradient measures 6.2 mmHg. Aortic valve area, by VTI measures 2.78 cm. Pulmonic Valve: The pulmonic valve was normal in structure. Pulmonic valve regurgitation is not visualized. Aorta: The aortic  root and ascending aorta are structurally normal, with no evidence of dilitation. IAS/Shunts: No atrial level shunt detected by color flow Doppler.  LEFT VENTRICLE PLAX 2D LVIDd:         4.00 cm LVIDs:         2.20 cm LV PW:         1.00 cm LV IVS:        0.80 cm LVOT diam:     2.00 cm LV SV:         68 LV SV Index:   35 LVOT Area:     3.14 cm  RIGHT VENTRICLE RV Basal diam:  3.00 cm RV S prime:     11.80 cm/s TAPSE (M-mode): 2.5 cm LEFT ATRIUM             Index        RIGHT ATRIUM           Index LA diam:        4.00 cm 2.07 cm/m   RA Area:     19.70 cm LA Vol (A2C):   35.7 ml 18.46 ml/m  RA Volume:   55.60 ml  28.75 ml/m LA Vol (A4C):   36.0 ml 18.62 ml/m LA Biplane Vol: 37.0 ml 19.13 ml/m  AORTIC VALVE                    PULMONIC VALVE AV Area (Vmax):    2.43 cm     PV Vmax:        0.79 m/s AV Area (Vmean):   2.25 cm     PV Vmean:       60.750 cm/s AV Area (VTI):     2.78 cm     PV VTI:         0.132 m AV Vmax:           125.00 cm/s  PV Peak grad:   2.5 mmHg AV Vmean:          89.700 cm/s  PV Mean grad:   1.5 mmHg AV VTI:            0.244 m      RVOT Peak grad: 3 mmHg AV Peak Grad:      6.2 mmHg AV Mean Grad:      4.0 mmHg LVOT Vmax:         96.60 cm/s LVOT Vmean:        64.300 cm/s LVOT VTI:          0.216 m LVOT/AV VTI ratio: 0.89  AORTA Ao Root diam: 3.83 cm MITRAL VALVE                TRICUSPID VALVE MV Area (PHT): 2.68  cm     TR Peak grad:   21.0 mmHg MV Area VTI:   2.56 cm     TR Vmax:        229.00 cm/s MV Peak grad:  6.0 mmHg MV Mean grad:  2.0 mmHg     SHUNTS MV Vmax:       1.22 m/s     Systemic VTI:  0.22 m MV Vmean:      73.7 cm/s    Systemic Diam: 2.00 cm MV Decel Time: 283 msec     Pulmonic VTI:  0.120 m MV E velocity: 76.30 cm/s MV A velocity: 110.00 cm/s MV E/A ratio:  0.69 Serafina Royals MD Electronically signed by Serafina Royals MD Signature Date/Time: 02/23/2021/5:06:10 PM  Final     Microbiology: Recent Results (from the past 240 hour(s))  Resp Panel by RT-PCR (Flu A&B, Covid) Nasopharyngeal Swab     Status: None   Collection Time: 02/22/21 11:43 PM   Specimen: Nasopharyngeal Swab; Nasopharyngeal(NP) swabs in vial transport medium  Result Value Ref Range Status   SARS Coronavirus 2 by RT PCR NEGATIVE NEGATIVE Final    Comment: (NOTE) SARS-CoV-2 target nucleic acids are NOT DETECTED.  The SARS-CoV-2 RNA is generally detectable in upper respiratory specimens during the acute phase of infection. The lowest concentration of SARS-CoV-2 viral copies this assay can detect is 138 copies/mL. A negative result does not preclude SARS-Cov-2 infection and should not be used as the sole basis for treatment or other patient management decisions. A negative result may occur with  improper specimen collection/handling, submission of specimen other than nasopharyngeal swab, presence of viral mutation(s) within the areas targeted by this assay, and inadequate number of viral copies(<138 copies/mL). A negative result must be combined with clinical observations, patient history, and epidemiological information. The expected result is Negative.  Fact Sheet for Patients:  EntrepreneurPulse.com.au  Fact Sheet for Healthcare Providers:  IncredibleEmployment.be  This test is no t yet approved or cleared by the [place] FDA and  has been authorized for detection and/or  diagnosis of SARS-CoV-2 by FDA under an Emergency Use Authorization (EUA). This EUA will remain  in effect (meaning this test can be used) for the duration of the COVID-19 declaration under Section 564(b)(1) of the Act, 21 U.S.C.section 360bbb-3(b)(1), unless the authorization is terminated  or revoked sooner.       Influenza A by PCR NEGATIVE NEGATIVE Final   Influenza B by PCR NEGATIVE NEGATIVE Final    Comment: (NOTE) The Xpert Xpress SARS-CoV-2/FLU/RSV plus assay is intended as an aid in the diagnosis of influenza from Nasopharyngeal swab specimens and should not be used as a sole basis for treatment. Nasal washings and aspirates are unacceptable for Xpert Xpress SARS-CoV-2/FLU/RSV testing.  Fact Sheet for Patients: EntrepreneurPulse.com.au  Fact Sheet for Healthcare Providers: IncredibleEmployment.be  This test is not yet approved or cleared by the [place] FDA and has been authorized for detection and/or diagnosis of SARS-CoV-2 by FDA under an Emergency Use Authorization (EUA). This EUA will remain in effect (meaning this test can be used) for the duration of the COVID-19 declaration under Section 564(b)(1) of the Act, 21 U.S.C. section 360bbb-3(b)(1), unless the authorization is terminated or revoked.  Performed at [facility], [address] [place], [place] [address]   Culture, blood (routine x 2)     Status: None (Preliminary result)   Collection Time: 02/22/21 11:44 PM   Specimen: BLOOD  Result Value Ref Range Status   Specimen Description BLOOD LEFT FOREARM  Final   Special Requests   Final    BOTTLES DRAWN AEROBIC AND ANAEROBIC Blood Culture adequate volume   Culture   Final    NO GROWTH 1 DAY Performed at [facility], [address] [place], [place] [address]    Report Status PENDING  Incomplete  Urine Culture     Status: None   Collection Time: 02/22/21 11:45 PM   Specimen: Urine, Random   Result Value Ref Range Status   Specimen Description   Final    URINE, RANDOM Performed at [facility], [address], [place], [place] [address]    Special Requests   Final    NONE Performed at [facility], [place]  [address] [place], [place] [address]    Culture   Final    NO GROWTH Performed at [facility], [place] [address], [place], [place] [address]    Report Status 02/24/2021 FINAL  Final  Culture, blood (Routine X 2) w Reflex to ID Panel     Status: None (Preliminary result)   Collection Time: 02/23/21  7:17 AM   Specimen: BLOOD  Result Value Ref Range Status   Specimen Description BLOOD BLOOD RIGHT HAND  Final   Special Requests   Final    BOTTLES DRAWN AEROBIC AND ANAEROBIC Blood Culture adequate volume   Culture   Final    NO GROWTH < 24 HOURS Performed at [facility], [address] [place], [place] [address]    Report Status PENDING  Incomplete     Labs: Basic Metabolic Panel: Recent Labs  Lab 02/22/21 2243 02/23/21 1440 02/24/21 0433  NA 135 133* 138  K 4.2 4.0 3.6  CL 102 100 108  CO2 25 26 23   GLUCOSE 163* 173* 157*  BUN 19 17 14   CREATININE 1.12 1.02 0.90  CALCIUM 9.4 8.9 8.8*   Liver Function Tests: Recent Labs  Lab 02/22/21 2243  AST 18  ALT 7  ALKPHOS 97  BILITOT 1.2  PROT 7.3  ALBUMIN 4.7   No results for input(s): LIPASE, AMYLASE in the last 168 hours. No results for input(s): AMMONIA in the last 168 hours. CBC: Recent Labs  Lab 02/23/21 0014  WBC 7.6  NEUTROABS 5.4  HGB 13.2  HCT 37.8*  MCV 87.5  PLT 152   Cardiac Enzymes: No results for input(s): CKTOTAL, CKMB, CKMBINDEX, TROPONINI in the last 168 hours. BNP: BNP (last 3 results) No results for input(s): BNP in the last 8760 hours.  ProBNP (last 3 results) No results for input(s): PROBNP in the last 8760 hours.  CBG: Recent Labs  Lab 02/23/21 1330 02/23/21 1807 02/23/21 2037 02/24/21 0857 02/24/21 1200  GLUCAP 180* 156*  165* 171* 154*       Signed:  Irine Seal MD.  [facility] 02/24/2021, 3:08 PM

## 2021-02-24 NOTE — Progress Notes (Signed)
Mobility Specialist - Progress Note    02/24/21 1500  Mobility  Activity Ambulated with assistance in room;Stood at bedside;Transferred from bed to chair;Dangled on edge of bed  Level of Assistance Standby assist, set-up cues, supervision of patient - no hands on  Assistive Device Standard walker  Distance Ambulated (ft) 10 ft  Activity Response Tolerated well  $Mobility charge 1 Mobility   Pt supine on RA upon arrival. Pt sat EOB + STS with supervision to assist with pericare. Pt ambulated with supervision to transfer B-C. Pt left with needs in reach and family at bedside.  Kathee Delton Mobility Specialist 02/24/21, 3:55 PM

## 2021-02-24 NOTE — Progress Notes (Signed)
Pt d/c to home via wife. Ivs removed intact. VSS. Education completed. All questions answered. All belongings sent with wife.

## 2021-02-24 NOTE — Progress Notes (Signed)
Neurology Progress Note Mark Riggs MR# [id number] 02/24/2021  S: no overnight events; no new complaints.  O: Current vital signs: BP 110/62 (BP Location: Left Arm)    Pulse 64    Temp 97.7 F (36.5 C)    Resp 16    Ht 5\' 7"  (1.702 m)    Wt 81.6 kg    SpO2 98%    BMI 28.19 kg/m  Vital signs in last 24 hours: Temp:  [97.5 F (36.4 C)-97.9 F (36.6 C)] 97.7 F (36.5 C) (02/17 0856) Pulse Rate:  [58-74] 64 (02/17 0856) Resp:  [16-22] 16 (02/17 0856) BP: (110-156)/(62-78) 110/62 (02/17 0856) SpO2:  [96 %-100 %] 98 % (02/17 0856)  Exam remains unchanged Physical Exam  Constitutional: Appears well-developed and well-nourished.  Psych: Affect appropriate to situation Eyes: No scleral injection HENT: No OP obstruction. Head: Normocephalic.  Cardiovascular: Normal rate and regular rhythm.  Respiratory: Effort normal, symmetric excursions bilaterally, no audible wheezing. GI: Soft.  No distension. There is no tenderness.  Skin: WDI   Neuro: Patient is awake, alert, oriented to person, place, time, situation. EOMI without ptosis or diplopia.  Facial sensation is symmetric to temperature Facial movement is symmetric.  Hearing is intact to voice. Tone is increased. Bulk is normal. 5/5 strength was present in all four extremities. Sensation is symmetric to light touch and temperature in the arms and legs. Bradykinesia in upper extremities with significant resting tremor in left hand.  He has cog wheel rigidity in all extremities with left>>right. Gait - Deferred  NIHSS 0  Labs     Component Value Date/Time   WBC 7.6 02/23/2021 0014   RBC 4.32 02/23/2021 0014   HGB 13.2 02/23/2021 0014   HCT 37.8 (L) 02/23/2021 0014   PLT 152 02/23/2021 0014   MCV 87.5 02/23/2021 0014   MCH 30.6 02/23/2021 0014   MCHC 34.9 02/23/2021 0014   RDW 12.6 02/23/2021 0014   LYMPHSABS 1.6 02/23/2021 0014   MONOABS 0.5 02/23/2021 0014   EOSABS 0.1 02/23/2021 0014   BASOSABS 0.1 02/23/2021  0014   Lab Results  Component Value Date   HGBA1C 6.0 (H) 02/23/2021   and  Lab Results  Component Value Date   LDLCALC 52 02/23/2021   Imaging I have reviewed images in epic and the results pertinent to this consultation are: MRI brain showed no acute infarct, mass effect or extra-axial collection. Unchanged chronic microhemorrhage in the left cerebellum. There is multifocal hyperintense T2-weighted signal within the white matter.   Echocardiogram read as left ventricular EF 60 - 65%. The left ventricle has no regional wall motion abnormalities. No shunt or mention of LVT.  Carotid Dopplers read as No significant atherosclerotic plaque or evidence of stenosis in either internal carotid artery. Vertebral arteries are patent with normal antegrade flow.  Assessment: Mark Riggs is a 81 y.o. male PMHx DM 2, HTN, HLD, Parkinson's with transient left sided weakness which resolved prior to arrival. He does have vascular risk factors and will need TIA workup.   Impression:  TIA - Transient left sided weakness HTN HLD DM 2 PD   Plan: - Continue statin. - Continue aspirin 81mg  daily. - Continue clopidogrel 75mg  daily for 3 weeks. - BP goal <140/90. - TIA workup is complete. - Follow up with outpatient neurology. - Neurology will remain available, please call for questions.   Electronically signed by:  Lynnae Sandhoff, MD Page: [phone] 02/24/2021, 9:14 AM  If 7pm- 7am, please page neurology on call  as listed in [place].

## 2021-02-24 NOTE — TOC Initial Note (Addendum)
Transition of Care [facility]) - Initial/Assessment Note    Patient Details  Name: Mark Riggs MRN: [id number] Date of Birth: 06/17/1940  [facility]) CM/SW Contact:    Mark Ivan, LCSW Phone Number: 02/24/2021, 9:24 AM  Clinical Narrative:                CSW spoke with patient regarding DC planning. Patient defers to his wife. Spoke with wife Mark Riggs. Patient lives with wife who will provide transportation. PCP is Mark Riggs. Pharmacy is [facility]. No HH, SNF, or DME history.  Confirmed home address. HH to call wife for scheduling, confirmed phone number.  No agency preference.  CSW reached out to Mark Riggs with [facility] who confirmed he can accept patient for HHPT and OT.   Expected Discharge Plan: [place] Barriers to Discharge: Continued Medical Work up   Patient Goals and CMS Choice Patient states their goals for this hospitalization and ongoing recovery are:: home with home health CMS Medicare.gov Compare Post Acute Care list provided to:: Patient Choice offered to / list presented to : Patient, Spouse  Expected Discharge Plan and Services Expected Discharge Plan: [place]       Living arrangements for the past 2 months: Single Family Home                           HH Arranged: PT, OT          Prior Living Arrangements/Services Living arrangements for the past 2 months: Single Family Home Lives with:: Spouse Patient language and need for interpreter reviewed:: Yes Do you feel safe going back to the place where you live?: Yes      Need for Family Participation in Patient Care: Yes (Comment) Care giver support system in place?: Yes (comment)   Criminal Activity/Legal Involvement Pertinent to Current Situation/Hospitalization: No - Comment as needed  Activities of Daily Living Home Assistive Devices/Equipment: None ADL Screening (condition at time of admission) Patient's cognitive ability adequate to safely  complete daily activities?: Yes Is the patient deaf or have difficulty hearing?: No Does the patient have difficulty seeing, even when wearing glasses/contacts?: No Does the patient have difficulty concentrating, remembering, or making decisions?: No Patient able to express need for assistance with ADLs?: Yes Does the patient have difficulty dressing or bathing?: No Independently performs ADLs?: Yes (appropriate for developmental age) Does the patient have difficulty walking or climbing stairs?: No Weakness of Legs: Both Weakness of Arms/Hands: None  Permission Sought/Granted Permission sought to share information with : Facility Sport and exercise psychologist, Family Supports Permission granted to share information with : Yes, Verbal Permission Granted  Share Information with NAME: spouse  Permission granted to share info w AGENCY: [place] agencies        Emotional Assessment       Orientation: : Oriented to Self, Oriented to Place, Oriented to  Time, Oriented to Situation Alcohol / Substance Use: Not Applicable Psych Involvement: No (comment)  Admission diagnosis:  TIA (transient ischemic attack) [G45.9] Facial droop [R29.810] Generalized weakness [R53.1] Altered mental status, unspecified altered mental status type [R41.82] Patient Active Problem List   Diagnosis Date Noted   TIA (transient ischemic attack) 02/23/2021   Parkinson's disease ([place]) 02/23/2021   Diabetes mellitus without complication ([place])    Hypertension    BPH (benign prostatic hyperplasia)    S/P lumbar spine operation 09/22/2018   Left flank pain 12/02/2017   PCP:  Albina Billet, MD Pharmacy:   [facility], [place] [address] Phone: [phone] Fax: [place], [place] - [place] [facility] [address] AT [address] [facility] [place] [address] Phone: [phone] Fax: [phone]     Social Determinants  of Health (SDOH) Interventions    Readmission Risk Interventions No flowsheet data found.

## 2021-02-28 LAB — CULTURE, BLOOD (ROUTINE X 2)
Culture: NO GROWTH
Culture: NO GROWTH
Special Requests: ADEQUATE
Special Requests: ADEQUATE

## 2021-10-27 ENCOUNTER — Ambulatory Visit (LOCAL_COMMUNITY_HEALTH_CENTER): Payer: PPO

## 2021-10-27 DIAGNOSIS — Z719 Counseling, unspecified: Secondary | ICD-10-CM

## 2021-10-27 DIAGNOSIS — Z23 Encounter for immunization: Secondary | ICD-10-CM | POA: Diagnosis not present

## 2021-10-27 NOTE — Progress Notes (Signed)
  Are you feeling sick today? No   Have you ever received a dose of COVID-19 Vaccine? [employer], [place], [place], [place], Other) Yes  If yes, which vaccine and how many doses? FIZER, 4      Did you bring the vaccination record card or other documentation?  Yes   Do you have a health condition or are undergoing treatment that makes you moderately or severely immunocompromised? This would include, but not be limited to: cancer, HIV, organ transplant, immunosuppressive therapy/high-dose corticosteroids, or moderate/severe primary immunodeficiency.  Yes (PARKINSON'S DISEASE, DIABETES)  Have you received COVID-19 vaccine before or during hematopoietic cell transplant (HCT) or CAR-T-cell therapies? No  Have you ever had an allergic reaction to: (This would include a severe allergic reaction or a reaction that caused hives, swelling, or respiratory distress, including wheezing.) A component of a COVID-19 vaccine or a previous dose of COVID-19 vaccine? Yes   Have you ever had an allergic reaction to another vaccine (other thanCOVID-19 vaccine) or an injectable medication? (This would include a severe allergic reaction or a reaction that caused hives, swelling, or respiratory distress, including wheezing.)   No    Do you have a history of any of the following:  Myocarditis or Pericarditis No  Dermal fillers:  No  Multisystem Inflammatory Syndrome (MIS-C or MIS-A)? No  COVID-19 disease within the past 3 months? No  Vaccinated with monkeypox vaccine in the last 4 weeks? No  Eligible and administered Cormirnaty 12Y+, 2023-2024 vaccine, monitored, tolerated well. Given VIS and NCIR, discussed and understood. M.Nakiea Metzner, LPN.

## 2022-05-09 DEATH — deceased
